# Patient Record
Sex: Male | Born: 1935 | Race: Black or African American | Hispanic: No | Marital: Married | Smoking: Never smoker
Health system: Southern US, Community
[De-identification: ages and names within clinical notes are randomized; demographics above are authoritative.]

## PROBLEM LIST (undated history)

## (undated) DIAGNOSIS — H547 Unspecified visual loss: Secondary | ICD-10-CM

## (undated) DIAGNOSIS — H409 Unspecified glaucoma: Secondary | ICD-10-CM

---

## 2000-05-20 ENCOUNTER — Encounter (INDEPENDENT_AMBULATORY_CARE_PROVIDER_SITE_OTHER): Payer: Self-pay | Admitting: Specialist

## 2000-05-20 ENCOUNTER — Other Ambulatory Visit: Admission: RE | Admit: 2000-05-20 | Discharge: 2000-05-20 | Payer: Self-pay | Admitting: Urology

## 2000-06-07 ENCOUNTER — Encounter: Admission: RE | Admit: 2000-06-07 | Discharge: 2000-06-07 | Payer: Self-pay | Admitting: Urology

## 2000-06-07 ENCOUNTER — Encounter: Payer: Self-pay | Admitting: Urology

## 2000-06-10 ENCOUNTER — Encounter: Admission: RE | Admit: 2000-06-10 | Discharge: 2000-09-08 | Payer: Self-pay | Admitting: Radiation Oncology

## 2000-09-24 ENCOUNTER — Encounter: Admission: RE | Admit: 2000-09-24 | Discharge: 2000-12-23 | Payer: Self-pay | Admitting: Radiation Oncology

## 2000-10-21 ENCOUNTER — Encounter: Payer: Self-pay | Admitting: Urology

## 2000-10-21 ENCOUNTER — Ambulatory Visit (HOSPITAL_BASED_OUTPATIENT_CLINIC_OR_DEPARTMENT_OTHER): Admission: RE | Admit: 2000-10-21 | Discharge: 2000-10-21 | Payer: Self-pay | Admitting: Urology

## 2004-10-16 ENCOUNTER — Encounter: Admission: RE | Admit: 2004-10-16 | Discharge: 2004-10-16 | Payer: Self-pay | Admitting: Internal Medicine

## 2008-11-13 ENCOUNTER — Ambulatory Visit (HOSPITAL_COMMUNITY): Admission: RE | Admit: 2008-11-13 | Discharge: 2008-11-13 | Payer: Self-pay | Admitting: Specialist

## 2011-03-06 NOTE — Op Note (Signed)
[place]. [facility]  Patient:    Greg Scott, Greg Scott                          MRN: [id number] Proc. Date: 10/21/00 Adm. Date:  [id number] Attending:  Londell Moh CC:         Margaretmary Dys, M.D.             Juluis Mire, M.D.                           Operative Report  PREOPERATIVE DIAGNOSIS:  Carcinoma of the prostate.  POSTOPERATIVE DIAGNOSIS:  Carcinoma of the prostate.  PROCEDURE PERFORMED:  Radiation seed implantation.  SURGEON:  Jamison Neighbor, M.D.  RADIATION ONCOLOGIST:  Margaretmary Dys, M.D., Maryln Gottron, M.D.  COMPLICATIONS:  None.  DRAINS:  16-French Foley catheter.  BRIEF HISTORY:  This 77 year old male status post TURP a number of years ago. The patient has been followed for sometime and never had any trouble with difficulty voiding.  We did note however there was a rise on his PSA and we noted that there was regrowth of the prostate on rectal examination.  The PSA went to 6.79 and for that reason underwent prostatic ultrasound guided biopsy. He was found to have a Gleason score of 6 adenocarcinoma on the right in 5% of biopsy, and Gleason score 6 adenocarcinoma on the left in 30% of the biopsy. The patient was advised of his options including watchful waiting, surgery, external beam radiation therapy, radiation seeds, cryosurgery or delayed hormonal therapy.  The patient felt he did not wish to have radical prostatectomy at his age and elected to undergo seed implantation.  We did appraise him of the fact that with a TURP defect, there is an increase risk that seeds will enter the prostate and/or urethra, but given the relatively large volume of his prostate on ultrasound, it is likely that there is adequate tissue for implantation.  The patient has been evaluated by the radiation oncologist and fully appraised of the pros and cons of this procedure both by me and by the radiation team.  He gave full and  informed consent.  DESCRIPTION OF PROCEDURE:  After successful induction of general anesthesia, the patient was placed in the dorsolithotomy position, prepped with Betadine, and draped in the usual sterile fashion.  He had a Foley catheter in place as well as a rectal tube.  The step device was secured to the bed and the ultrasound probe was inserted.  The prostate was imaged and the patient was positioned so that the images were identical to those seen in the preplanning session.  Anchors were placed at C3.0 and E3.0 with good stabilization.  The patient had a total of 80 seeds placed through 20 needles, and postoperative imaging showed excellent coverage of the prostate with good sparing of the central urethra.  The patient has a target dose of 145,000 of the I125 Isotop. Cystoscopy was performed.  The urethra was visualized in its entirety and there was no evidence of any seed material within the urethra or within the bladder.  The patient does have some regrowth of the prostate, but does not appear to be obstructed.  Postoperative fluoroscopy showed very good coverage, but we do note that one set of seeds was pulled down in the perineum but as far from the rectum.  The patient had a Foley catheter  reinserted and placed to straight drainage.  He will be sent home with Darvocet-N 100, Pyridium he can take for burning and Septra DS.  He will return to the office in one to two days for removal of the Foley catheter.  The patient tolerated the procedure and was taken to the recovery room in good condition. DD:  10/21/00 TD:  10/21/00 Job: [id number]

## 2014-11-01 ENCOUNTER — Emergency Department (HOSPITAL_COMMUNITY): Payer: Medicare PPO

## 2014-11-01 ENCOUNTER — Emergency Department (HOSPITAL_COMMUNITY)
Admission: EM | Admit: 2014-11-01 | Discharge: 2014-11-01 | Disposition: A | Discharge status: Home / Self Care | Payer: Medicare PPO | Attending: Emergency Medicine | Admitting: Emergency Medicine

## 2014-11-01 ENCOUNTER — Encounter (HOSPITAL_COMMUNITY): Payer: Self-pay | Admitting: Emergency Medicine

## 2014-11-01 DIAGNOSIS — R270 Ataxia, unspecified: Secondary | ICD-10-CM | POA: Diagnosis not present

## 2014-11-01 DIAGNOSIS — Z792 Long term (current) use of antibiotics: Secondary | ICD-10-CM | POA: Diagnosis not present

## 2014-11-01 DIAGNOSIS — H539 Unspecified visual disturbance: Secondary | ICD-10-CM | POA: Diagnosis not present

## 2014-11-01 DIAGNOSIS — R2689 Other abnormalities of gait and mobility: Secondary | ICD-10-CM | POA: Diagnosis present

## 2014-11-01 LAB — CBC WITH DIFFERENTIAL/PLATELET
BASOS PCT: 0 % (ref 0–1)
Basophils Absolute: 0 10*3/uL (ref 0.0–0.1)
EOS ABS: 0 10*3/uL (ref 0.0–0.7)
EOS PCT: 0 % (ref 0–5)
HCT: 39.4 % (ref 39.0–52.0)
Hemoglobin: 13.4 g/dL (ref 13.0–17.0)
Lymphocytes Relative: 19 % (ref 12–46)
Lymphs Abs: 1.4 10*3/uL (ref 0.7–4.0)
MCH: 32.8 pg (ref 26.0–34.0)
MCHC: 34 g/dL (ref 30.0–36.0)
MCV: 96.3 fL (ref 78.0–100.0)
Monocytes Absolute: 0.2 10*3/uL (ref 0.1–1.0)
Monocytes Relative: 3 % (ref 3–12)
NEUTROS ABS: 5.5 10*3/uL (ref 1.7–7.7)
NEUTROS PCT: 78 % — AB (ref 43–77)
Platelets: 221 10*3/uL (ref 150–400)
RBC: 4.09 MIL/uL — ABNORMAL LOW (ref 4.22–5.81)
RDW: 14.7 % (ref 11.5–15.5)
WBC: 7.2 10*3/uL (ref 4.0–10.5)

## 2014-11-01 LAB — PROTIME-INR
INR: 1.04 (ref 0.00–1.49)
Prothrombin Time: 13.7 seconds (ref 11.6–15.2)

## 2014-11-01 LAB — BASIC METABOLIC PANEL
Anion gap: 9 (ref 5–15)
BUN: 11 mg/dL (ref 6–23)
CO2: 26 mmol/L (ref 19–32)
Calcium: 8.8 mg/dL (ref 8.4–10.5)
Chloride: 106 mEq/L (ref 96–112)
Creatinine, Ser: 1.38 mg/dL — ABNORMAL HIGH (ref 0.50–1.35)
GFR calc Af Amer: 51 mL/min — ABNORMAL LOW (ref >90)
GFR calc non Af Amer: 44 mL/min — ABNORMAL LOW (ref >90)
GLUCOSE: 165 mg/dL — AB (ref 70–99)
POTASSIUM: 3.9 mmol/L (ref 3.5–5.1)
Sodium: 141 mmol/L (ref 135–145)

## 2014-11-01 LAB — TROPONIN I: Troponin I: <0.03 ng/mL (ref <0.031)

## 2014-11-01 NOTE — ED Provider Notes (Addendum)
CSN: [id number]     Arrival date & time 11/01/14  1258 History   First MD Initiated Contact with Patient 11/01/14 1310     Chief Complaint  Patient presents with  . Gait Problem  . Altered Mental Status      HPI  Patient presents for evaluation with difficulty ambulating after being seen by his eye doctor and having his eyes dilated.  Patient has no history of stroke or TIAs. Was in his normal state of health. [place] for cataracts and glaucoma. His eyes dilated. Walk only a short distance to his car afterwards. [place]. When getting out of the car and walking to the steps into the house he had difficulty.  Simply back in the car and was driving him to his doctor's office. They realize he didn't have identification or insurance information so they drove back to the house. The patient was unable to walk into the house and up the stairs with some assistance. States his symptoms had improved but not resolved.  Denies any weakness. He states he is wearing "heavy left issues that make it difficult". He has not noticed any difference left versus right. He does not have a headache. He states "the room looks steamy everything is a little blurry". He reports sustaining vision is normal since his glaucoma. His pressures were normal at his ophthalmologist today.  History reviewed. No pertinent past medical history. History reviewed. No pertinent past surgical history. History reviewed. No pertinent family history. History  Substance Use Topics  . Smoking status: Not on file  . Smokeless tobacco: Not on file  . Alcohol Use: Not on file    Review of Systems  Constitutional: Negative for fever, chills, diaphoresis, appetite change and fatigue.  HENT: Negative for mouth sores, sore throat and trouble swallowing.   Eyes: Positive for visual disturbance.  Respiratory: Negative for cough, chest  tightness, shortness of breath and wheezing.   Cardiovascular: Negative for chest pain.  Gastrointestinal: Negative for nausea, vomiting, abdominal pain, diarrhea and abdominal distention.  Endocrine: Negative for polydipsia, polyphagia and polyuria.  Genitourinary: Negative for dysuria, frequency and hematuria.  Musculoskeletal: Positive for gait problem.  Skin: Negative for color change, pallor and rash.  Neurological: Negative for dizziness, syncope, light-headedness and headaches.  Hematological: Does not bruise/bleed easily.  Psychiatric/Behavioral: Negative for behavioral problems and confusion.      Allergies  Review of patient's allergies indicates no known allergies.  Home Medications   Prior to Admission medications   Medication Sig Start Date End Date Taking? Authorizing Provider  latanoprost (XALATAN) 0.005 % ophthalmic solution Place 1 drop into both eyes at bedtime.   Yes Historical Provider, MD  tobramycin (TOBREX) 0.3 % ophthalmic solution Place 1 drop into both eyes 2 (two) times daily.   Yes Historical Provider, MD   BP 155/72 mmHg  Pulse 98  Temp(Src) 98.8 F (37.1 C) (Oral)  Resp 18  SpO2 100% Physical Exam  Constitutional: He is oriented to person, place, and time. He appears well-developed and well-nourished. No distress.  HENT:  Head: Normocephalic.  Eyes: Conjunctivae are normal. Pupils are equal, round, and reactive to light. No scleral icterus.  Symmetric dilatation proximally to 9 mm. Retinas appear normal.  Neck: Normal range of motion. Neck supple. No thyromegaly present.  Cardiovascular: Normal rate and regular rhythm.  Exam reveals no gallop and no friction rub.   No murmur heard. Pulmonary/Chest: Effort  normal and breath sounds normal. No respiratory distress. He has no wheezes. He has no rales.  Abdominal: Soft. Bowel sounds are normal. He exhibits no distension. There is no tenderness. There is no rebound.  Musculoskeletal: Normal range of  motion.  Neurological: He is alert and oriented to person, place, and time.  Normal cranial nerve exam with exception of his decreased vision changes as above. No pronator drift. No leg drift. No difficulty with rapid alternating movements. As he stands he has has normal strength reasonably do it in the room. He has somewhat broad-based gait which son describes as normal for him.  Skin: Skin is warm and dry. No rash noted.  Psychiatric: He has a normal mood and affect. His behavior is normal.    ED Course  Procedures (including critical care time) Labs Review Labs Reviewed  CBC WITH DIFFERENTIAL - Abnormal; Notable for the following:    RBC 4.09 (*)    Neutrophils Relative % 78 (*)    All other components within normal limits  BASIC METABOLIC PANEL - Abnormal; Notable for the following:    Glucose, Bld 165 (*)    Creatinine, Ser 1.38 (*)    GFR calc non Af Amer 44 (*)    GFR calc Af Amer 51 (*)    All other components within normal limits  PROTIME-INR  TROPONIN I    Imaging Review Ct Head Wo Contrast  11/01/2014   CLINICAL DATA:  Code stroke. Ataxia. Increased confusion from baseline. Symptoms began at 12 o'clock today.  EXAM: CT HEAD WITHOUT CONTRAST  TECHNIQUE: Contiguous axial images were obtained from the base of the skull through the vertex without intravenous contrast.  COMPARISON:  None.  FINDINGS: Moderate generalized atrophy is present. No acute infarct, hemorrhage, or mass lesion is present. The basal ganglia are intact. The ventricles are proportionate to the degree of atrophy. There is no significant extra-axial fluid collection.  Atherosclerotic changes are noted in the cavernous internal carotid arteries. The paranasal sinuses and mastoid air cells are clear. The cannot scratch the the calvarium is intact.  IMPRESSION: 1. Moderate generalized atrophy. 2. No acute intracranial abnormality or evidence for acute infarct. These results were called by telephone at the time of  interpretation on 11/01/2014 at 1:56 pm to Dr. Rolland Porter , who verbally acknowledged these results.   Electronically Signed   By: Gennette Pac M.D.   On: 11/01/2014 13:56     EKG Interpretation None      MDM   Final diagnoses:  Ataxia    I discussed the case with Dr. Cyril Mourning upon the patient's arrival I did not feel his neck criteria for stroke face normal exam of the sensation of difficulty walking with dilated eyes. Dr. Cyril Mourning felt this was appropriate to not activate him the code stroke and felt that with normal imaging the patient can be discharged of his symptoms improved.  Head CT shows no acute abnormalities.  You have difficulty with the MRI and refused any additional procedures. Axial images of his MRI were able to be obtained before he insists upon being removed from the MRI secondary to claustrophobia.  I have reexamined him in the room and he is able to ambulate states his symptoms are "much much better".  He was able to undergo T2 sagittal images. With this limited imaging no acute rebound is noted. Patient maintains that he is at his baseline and think he is appropriate for discharge.  I discussed with he and his  son that his symptoms are likely related to the vision changes from the dilation. As his dilated eyes have improved still has his ability to ambulate here. He has any worsening symptoms whatsoever at home of asked him to recheck and expressed understanding of this.  I discussed upon the patient's arrival and on my first recheck the possibility of staying in the hospital for further testing for TIAs and the patient is adamant that "I'm fine and I really don't want to stay". He maintains this at discharge.    Rolland Porter, MD 11/01/14 1528  Rolland Porter, MD 11/01/14 [phone]

## 2014-11-01 NOTE — ED Notes (Addendum)
Pt reports to ED for ataxia with ambulation, increased confusion from baseline, onset about 1200 today. Pt states that he felt as though his shoes were too heavy for his feet to lift them. Neuro exam intact, pt denies headache, pt able to walk without much difficulty in triage, family member states that patient was unable to walk from car to house.

## 2014-11-01 NOTE — ED Notes (Signed)
Patient was not in room in order to get vital

## 2014-11-01 NOTE — ED Notes (Signed)
Patient transported to MRI 

## 2014-11-01 NOTE — ED Notes (Signed)
Patient transported to CT 

## 2014-11-01 NOTE — ED Notes (Signed)
MD at bedside. 

## 2014-11-01 NOTE — ED Notes (Signed)
Fayrene FearingJames, MD, made aware of stroke-like symptoms onset 1 hour ago.

## 2014-11-01 NOTE — Discharge Instructions (Signed)
Return to the emergency room if you have any additional difficulties with your balance coordination or strength.

## 2015-04-22 ENCOUNTER — Emergency Department (HOSPITAL_COMMUNITY): Payer: Medicare PPO

## 2015-04-22 ENCOUNTER — Inpatient Hospital Stay (HOSPITAL_COMMUNITY)
Admission: EM | Admit: 2015-04-22 | Discharge: 2015-05-10 | DRG: 689 | Disposition: A | Discharge status: Skilled Nursing Facility | Payer: Medicare PPO | Attending: Internal Medicine | Admitting: Internal Medicine

## 2015-04-22 ENCOUNTER — Encounter (HOSPITAL_COMMUNITY): Payer: Self-pay

## 2015-04-22 DIAGNOSIS — E875 Hyperkalemia: Secondary | ICD-10-CM | POA: Diagnosis present

## 2015-04-22 DIAGNOSIS — H547 Unspecified visual loss: Secondary | ICD-10-CM | POA: Diagnosis present

## 2015-04-22 DIAGNOSIS — S2239XA Fracture of one rib, unspecified side, initial encounter for closed fracture: Secondary | ICD-10-CM | POA: Diagnosis present

## 2015-04-22 DIAGNOSIS — Z79899 Other long term (current) drug therapy: Secondary | ICD-10-CM

## 2015-04-22 DIAGNOSIS — R1310 Dysphagia, unspecified: Secondary | ICD-10-CM | POA: Diagnosis present

## 2015-04-22 DIAGNOSIS — Z66 Do not resuscitate: Secondary | ICD-10-CM | POA: Diagnosis not present

## 2015-04-22 DIAGNOSIS — F1010 Alcohol abuse, uncomplicated: Secondary | ICD-10-CM | POA: Diagnosis present

## 2015-04-22 DIAGNOSIS — R4182 Altered mental status, unspecified: Secondary | ICD-10-CM

## 2015-04-22 DIAGNOSIS — H409 Unspecified glaucoma: Secondary | ICD-10-CM | POA: Diagnosis present

## 2015-04-22 DIAGNOSIS — F10239 Alcohol dependence with withdrawal, unspecified: Secondary | ICD-10-CM | POA: Diagnosis present

## 2015-04-22 DIAGNOSIS — N3001 Acute cystitis with hematuria: Principal | ICD-10-CM | POA: Diagnosis present

## 2015-04-22 DIAGNOSIS — D509 Iron deficiency anemia, unspecified: Secondary | ICD-10-CM | POA: Insufficient documentation

## 2015-04-22 DIAGNOSIS — E876 Hypokalemia: Secondary | ICD-10-CM | POA: Diagnosis present

## 2015-04-22 DIAGNOSIS — Z7189 Other specified counseling: Secondary | ICD-10-CM | POA: Insufficient documentation

## 2015-04-22 DIAGNOSIS — H540 Blindness, both eyes: Secondary | ICD-10-CM | POA: Diagnosis present

## 2015-04-22 DIAGNOSIS — R296 Repeated falls: Secondary | ICD-10-CM | POA: Diagnosis present

## 2015-04-22 DIAGNOSIS — Z681 Body mass index (BMI) 19 or less, adult: Secondary | ICD-10-CM

## 2015-04-22 DIAGNOSIS — N390 Urinary tract infection, site not specified: Secondary | ICD-10-CM

## 2015-04-22 DIAGNOSIS — R55 Syncope and collapse: Secondary | ICD-10-CM | POA: Diagnosis present

## 2015-04-22 DIAGNOSIS — G9340 Encephalopathy, unspecified: Secondary | ICD-10-CM | POA: Diagnosis present

## 2015-04-22 DIAGNOSIS — Z7401 Bed confinement status: Secondary | ICD-10-CM

## 2015-04-22 DIAGNOSIS — N179 Acute kidney failure, unspecified: Secondary | ICD-10-CM

## 2015-04-22 DIAGNOSIS — Z515 Encounter for palliative care: Secondary | ICD-10-CM

## 2015-04-22 DIAGNOSIS — R627 Adult failure to thrive: Secondary | ICD-10-CM | POA: Diagnosis present

## 2015-04-22 DIAGNOSIS — E860 Dehydration: Secondary | ICD-10-CM | POA: Diagnosis present

## 2015-04-22 DIAGNOSIS — E869 Volume depletion, unspecified: Secondary | ICD-10-CM | POA: Diagnosis present

## 2015-04-22 DIAGNOSIS — L89152 Pressure ulcer of sacral region, stage 2: Secondary | ICD-10-CM | POA: Diagnosis present

## 2015-04-22 DIAGNOSIS — R8281 Pyuria: Secondary | ICD-10-CM | POA: Diagnosis present

## 2015-04-22 DIAGNOSIS — T68XXXA Hypothermia, initial encounter: Secondary | ICD-10-CM

## 2015-04-22 DIAGNOSIS — E43 Unspecified severe protein-calorie malnutrition: Secondary | ICD-10-CM | POA: Insufficient documentation

## 2015-04-22 DIAGNOSIS — F0390 Unspecified dementia without behavioral disturbance: Secondary | ICD-10-CM | POA: Diagnosis present

## 2015-04-22 DIAGNOSIS — E162 Hypoglycemia, unspecified: Secondary | ICD-10-CM | POA: Insufficient documentation

## 2015-04-22 DIAGNOSIS — L8990 Pressure ulcer of unspecified site, unspecified stage: Secondary | ICD-10-CM | POA: Diagnosis present

## 2015-04-22 HISTORY — DX: Unspecified visual loss: H54.7

## 2015-04-22 HISTORY — DX: Unspecified glaucoma: H40.9

## 2015-04-22 LAB — URINALYSIS, ROUTINE W REFLEX MICROSCOPIC
GLUCOSE, UA: NEGATIVE mg/dL
Hgb urine dipstick: NEGATIVE
Ketones, ur: 15 mg/dL — AB
Nitrite: POSITIVE — AB
PH: 5.5 (ref 5.0–8.0)
Protein, ur: 30 mg/dL — AB
Specific Gravity, Urine: 1.025 (ref 1.005–1.030)
UROBILINOGEN UA: 1 mg/dL (ref 0.0–1.0)

## 2015-04-22 LAB — URINE MICROSCOPIC-ADD ON

## 2015-04-22 LAB — CBC WITH DIFFERENTIAL/PLATELET
Basophils Absolute: 0 10*3/uL (ref 0.0–0.1)
Basophils Relative: 0 % (ref 0–1)
Eosinophils Absolute: 0 10*3/uL (ref 0.0–0.7)
Eosinophils Relative: 0 % (ref 0–5)
HCT: 29 % — ABNORMAL LOW (ref 39.0–52.0)
HEMOGLOBIN: 9.9 g/dL — AB (ref 13.0–17.0)
LYMPHS ABS: 0.4 10*3/uL — AB (ref 0.7–4.0)
Lymphocytes Relative: 4 % — ABNORMAL LOW (ref 12–46)
MCH: 33.3 pg (ref 26.0–34.0)
MCHC: 34.1 g/dL (ref 30.0–36.0)
MCV: 97.6 fL (ref 78.0–100.0)
Monocytes Absolute: 0.2 10*3/uL (ref 0.1–1.0)
Monocytes Relative: 2 % — ABNORMAL LOW (ref 3–12)
NEUTROS ABS: 8 10*3/uL — AB (ref 1.7–7.7)
Neutrophils Relative %: 94 % — ABNORMAL HIGH (ref 43–77)
Platelets: 151 10*3/uL (ref 150–400)
RBC: 2.97 MIL/uL — ABNORMAL LOW (ref 4.22–5.81)
RDW: 13.5 % (ref 11.5–15.5)
WBC: 8.6 10*3/uL (ref 4.0–10.5)

## 2015-04-22 LAB — COMPREHENSIVE METABOLIC PANEL
ALBUMIN: 2.5 g/dL — AB (ref 3.5–5.0)
ALT: 36 U/L (ref 17–63)
AST: 46 U/L — AB (ref 15–41)
Alkaline Phosphatase: 50 U/L (ref 38–126)
Anion gap: 13 (ref 5–15)
BUN: 35 mg/dL — AB (ref 6–20)
CALCIUM: 8.1 mg/dL — AB (ref 8.9–10.3)
CHLORIDE: 110 mmol/L (ref 101–111)
CO2: 23 mmol/L (ref 22–32)
Creatinine, Ser: 1.31 mg/dL — ABNORMAL HIGH (ref 0.61–1.24)
GFR calc Af Amer: 54 mL/min — ABNORMAL LOW (ref >60)
GFR, EST NON AFRICAN AMERICAN: 47 mL/min — AB (ref >60)
GLUCOSE: 151 mg/dL — AB (ref 65–99)
Potassium: 3.2 mmol/L — ABNORMAL LOW (ref 3.5–5.1)
Sodium: 146 mmol/L — ABNORMAL HIGH (ref 135–145)
Total Bilirubin: 1.7 mg/dL — ABNORMAL HIGH (ref 0.3–1.2)
Total Protein: 5.2 g/dL — ABNORMAL LOW (ref 6.5–8.1)

## 2015-04-22 LAB — CBG MONITORING, ED: GLUCOSE-CAPILLARY: 140 mg/dL — AB (ref 65–99)

## 2015-04-22 LAB — I-STAT TROPONIN, ED: Troponin i, poc: 0.04 ng/mL (ref 0.00–0.08)

## 2015-04-22 LAB — PROTIME-INR
INR: 1.19 (ref 0.00–1.49)
PROTHROMBIN TIME: 15.2 s (ref 11.6–15.2)

## 2015-04-22 LAB — TSH: TSH: 2.416 u[IU]/mL (ref 0.350–4.500)

## 2015-04-22 LAB — LACTIC ACID, PLASMA: Lactic Acid, Venous: 2.1 mmol/L (ref 0.5–2.0)

## 2015-04-22 LAB — AMMONIA: Ammonia: 16 umol/L (ref 9–35)

## 2015-04-22 LAB — T4, FREE: Free T4: 0.88 ng/dL (ref 0.61–1.12)

## 2015-04-22 LAB — CK: Total CK: 533 U/L — ABNORMAL HIGH (ref 49–397)

## 2015-04-22 MED ORDER — VANCOMYCIN HCL IN DEXTROSE 1-5 GM/200ML-% IV SOLN
1000.0000 mg | Freq: Once | INTRAVENOUS | Status: AC
  Administered 2015-04-22: 1000 mg via INTRAVENOUS
  Filled 2015-04-22: qty 200

## 2015-04-22 MED ORDER — SODIUM CHLORIDE 0.9 % IV BOLUS (SEPSIS)
1000.0000 mL | Freq: Once | INTRAVENOUS | Status: AC
  Administered 2015-04-22: 1000 mL via INTRAVENOUS

## 2015-04-22 MED ORDER — SODIUM CHLORIDE 0.9 % IV BOLUS (SEPSIS)
1000.0000 mL | Freq: Once | INTRAVENOUS | Status: AC
Start: 2015-04-23 — End: 2015-04-23
  Administered 2015-04-23: 1000 mL via INTRAVENOUS

## 2015-04-22 MED ORDER — PIPERACILLIN-TAZOBACTAM IN DEX 2-0.25 GM/50ML IV SOLN
2.2500 g | Freq: Once | INTRAVENOUS | Status: AC
  Administered 2015-04-22: 2.25 g via INTRAVENOUS
  Filled 2015-04-22: qty 50

## 2015-04-22 NOTE — ED Notes (Signed)
bair hugger placed 

## 2015-04-22 NOTE — ED Notes (Signed)
Blanket warmer applied, labs drawn, family at bedside

## 2015-04-22 NOTE — ED Notes (Signed)
Family at the bedside.  Pt non-verbal  Pulls away from pain

## 2015-04-22 NOTE — ED Provider Notes (Signed)
CSN: [id number]     Arrival date & time 04/22/15  2101 History   First MD Initiated Contact with Patient 04/22/15 2113     Chief Complaint  Patient presents with  . Altered Mental Status   (Consider location/radiation/quality/duration/timing/severity/associated sxs/prior Treatment) Patient is a 81 y.o. male presenting with altered mental status. The history is provided by a relative and the EMS personnel. The history is limited by the condition of the patient.  Altered Mental Status Presenting symptoms: behavior changes, confusion, lethargy and partial responsiveness   Severity:  Severe Most recent episode: last seen 4 days ago by son. Timing:  Unable to specify Progression:  Worsening Chronicity:  New Context: dementia   Associated symptoms: weakness   Associated symptoms: no eye deviation and no visual change (blind at baseline)     Past Medical History  Diagnosis Date  . Glaucoma   . Blind    History reviewed. No pertinent past surgical history. History reviewed. No pertinent family history. History  Substance Use Topics  . Smoking status: Unknown If Ever Smoked  . Smokeless tobacco: Not on file  . Alcohol Use: Not on file    Review of Systems  Unable to perform ROS: Patient nonverbal  Neurological: Positive for speech difficulty and weakness.  Psychiatric/Behavioral: Positive for confusion.      Allergies  Review of patient's allergies indicates no known allergies.  Home Medications   Prior to Admission medications   Medication Sig Start Date End Date Taking? Authorizing Provider  latanoprost (XALATAN) 0.005 % ophthalmic solution Place 1 drop into both eyes at bedtime.   Yes Historical Provider, MD   ED Triage Vitals  Enc Vitals Group     BP 04/22/15 2114 139/88 mmHg     Pulse Rate 04/22/15 2114 30     Resp 04/22/15 2114 17     Temp 04/22/15 2111 95.6 F (35.3 C)     Temp Source 04/22/15 2111 Rectal     SpO2 04/22/15 2114 90 %     Weight 04/22/15 2114  120 lb (54.432 kg)     Height 04/22/15 2114 6' (1.829 m)     Head Cir --      Peak Flow --      Pain Score --      Pain Loc --      Pain Edu? --      Excl. in GC? --     Physical Exam  Constitutional: He appears well-developed. He appears lethargic. He has a sickly appearance. Nasal cannula in place.  HENT:  Head: Normocephalic and atraumatic.  Nose: Nose normal.  Mouth/Throat: Oropharynx is clear and moist. No oropharyngeal exudate.  Eyes: Pupils are equal, round, and reactive to light.  Sluggish but bilaterally symmetric and reactive pupils.  Significant cataracts b/l.  Blind/does not track   Neck: Normal range of motion. Neck supple.  Cardiovascular: Normal rate, regular rhythm, normal heart sounds and intact distal pulses.   No murmur heard. Pulmonary/Chest: Effort normal and breath sounds normal. No respiratory distress. He has no wheezes. He exhibits no tenderness.  Abdominal: Soft. He exhibits no distension. There is no tenderness. There is no guarding.  Genitourinary:  Loss of bladder/bowel   Musculoskeletal: Normal range of motion. He exhibits no tenderness.  Neurological: He appears lethargic. No cranial nerve deficit. GCS eye subscore is 3. GCS verbal subscore is 1. GCS motor subscore is 5.  Nonverbal.  Initially would open eyes to verbal stimuli and localize to pain.  Does  not follow commands.  Seen moving all extremities minimally.  Does not hold extremities up against gravity  Skin: Skin is warm and dry. He is not diaphoretic. No pallor.  Nursing note and vitals reviewed.   ED Course  Procedures (including critical care time) Labs Review Labs Reviewed  CBC WITH DIFFERENTIAL/PLATELET - Abnormal; Notable for the following:    RBC 2.97 (*)    Hemoglobin 9.9 (*)    HCT 29.0 (*)    Neutrophils Relative % 94 (*)    Neutro Abs 8.0 (*)    Lymphocytes Relative 4 (*)    Lymphs Abs 0.4 (*)    Monocytes Relative 2 (*)    All other components within normal limits   COMPREHENSIVE METABOLIC PANEL - Abnormal; Notable for the following:    Sodium 146 (*)    Potassium 3.2 (*)    Glucose, Bld 151 (*)    BUN 35 (*)    Creatinine, Ser 1.31 (*)    Calcium 8.1 (*)    Total Protein 5.2 (*)    Albumin 2.5 (*)    AST 46 (*)    Total Bilirubin 1.7 (*)    GFR calc non Af Amer 47 (*)    GFR calc Af Amer 54 (*)    All other components within normal limits  URINALYSIS, ROUTINE W REFLEX MICROSCOPIC (NOT AT [facility]) - Abnormal; Notable for the following:    Color, Urine AMBER (*)    Bilirubin Urine LARGE (*)    Ketones, ur 15 (*)    Protein, ur 30 (*)    Nitrite POSITIVE (*)    Leukocytes, UA SMALL (*)    All other components within normal limits  LACTIC ACID, PLASMA - Abnormal; Notable for the following:    Lactic Acid, Venous 2.1 (*)    All other components within normal limits  CK - Abnormal; Notable for the following:    Total CK 533 (*)    All other components within normal limits  CBG MONITORING, ED - Abnormal; Notable for the following:    Glucose-Capillary 140 (*)    All other components within normal limits  CULTURE, BLOOD (ROUTINE X 2)  CULTURE, BLOOD (ROUTINE X 2)  URINE CULTURE  PROTIME-INR  AMMONIA  TSH  T4, FREE  URINE MICROSCOPIC-ADD ON  I-STAT TROPOININ, ED    Imaging Review Dg Chest Portable 1 View  04/22/2015   CLINICAL DATA:  Altered mental status after being found down  EXAM: PORTABLE CHEST - 1 VIEW  COMPARISON:  11/13/2008  FINDINGS: Normal heart size and mediastinal contours.  No acute infiltrate or edema. No effusion or pneumothorax. Probable nipple shadow on the left.  Remote right fifth and sixth rib fractures. No acute osseous findings.  IMPRESSION: 1. No active disease. 2. Nodular density at the left base is likely a nipple shadow. Two-view chest with nipple markers could confirm if clinically appropriate.   Electronically Signed   By: Marnee Spring M.D.   On: 04/22/2015 21:36     EKG Interpretation   Date/Time:  Monday  April 22 2015 22:50:04 EDT Ventricular Rate:  79 PR Interval:  164 QRS Duration: 99 QT Interval:  453 QTC Calculation: 519 R Axis:   22 Text Interpretation:  Sinus rhythm Ventricular premature complex  Anteroseptal infarct, old Nonspecific T abnormalities, lateral leads  Prolonged QT interval No significant change since last tracing Confirmed  by Anitra Lauth  MD, Alphonzo Lemmings ([id number]) on 04/22/2015 11:53:29 PM      MDM  Final diagnoses:  Altered mental status, unspecified altered mental status type  AKI (acute kidney injury)   Pt is a 81 yo M with hx of glaucoma and blindness who presents today with AMS after being found down for an unknown amount of time.  Patient is nonverbal, so hx was obtained from his son.  Per report, patient has had a steady decline over the past few weeks.  Used to be able to take care of himself other than mild confusion (son took care of bills, etc but pt lived by himself).  Has had increasing difficulty ambulating over the past few months, then for a few weeks has been fairly nonverbal.  Son last saw him on Thursday night then went out of town (4 days ago).  They had a family friend that was supposed to stop in and check on the patient every day, but it is unclear if this ever happened.  Son called the patient and the caregiver every day but was unable to get in touch with either of them.  Came home today and found the patient slumped over 1/2 way on the couch, unresponsive.  Called EMS.   EMS found him with normal glucose, cool to the touch, palpable pulses and normotensive.  He was responsive to painful stimuli and had sluggishly reactive pupils.  Received < 500 cc NS bolus prior to arrival to the ED.  Hypothermic to 95.6 rectally on arrival to the ED.  Smelled strongly of urine.  Would open eyes to verbal stimuli and would localize to pain, but would not follow commands.  GCS 9 on arrival.   Given NS boluses, placed on bear hugger, and covered with broad spectrum Abx  (vanc and zosyn).  Worked up with septic labs.  Blood and urine cultures.  Temp foley placed.  CXR, EKG, and CT head ordered.   CXR with no obvious opacities or effusions.  Old rib fractures on right on rads report.  EKG with nonspecific T wave inversions but otherwise benign. Unchanged from January.  UA + for UTI CT head with no acute changes.  Labs show normal troponin, no leukocytosis, Cr 1.31 (at baseline), electrolytes unremarkable, but lactate 2.1 and CK 533 suggestive of dehydration.   Has perked up a fair amount since arrival from warming and receiving fluids.  Now is more alert and responsive.  Still not verbal or able to follow commands, but does hold eyes open and turns towards verbal stimuli more.  BP still normotensive.   Admitted to hospitalist team for further management of his urosepsis and dehydration.    If performed, labs, EKGs, and imaging were reviewed and interpreted by myself and my attending, and incorporated in the medical decision making.  Patient was seen with ED Attending, Dr. Lawernce Keas, MD     Lenell Antu, MD 04/23/15 1432  Gwyneth Sprout, MD 04/25/15 1515

## 2015-04-22 NOTE — ED Notes (Signed)
Pt here by ems, found on floor, last seen Friday by family at baseline, has some early dementia, responding to painful stimuli by grimacing otherwise non verbal, incontinent of urine,per family recent frequent falls, pupils sluggish but equal.

## 2015-04-23 ENCOUNTER — Encounter (HOSPITAL_COMMUNITY): Payer: Self-pay | Admitting: Nephrology

## 2015-04-23 ENCOUNTER — Inpatient Hospital Stay (HOSPITAL_COMMUNITY): Payer: Medicare PPO

## 2015-04-23 DIAGNOSIS — Z7401 Bed confinement status: Secondary | ICD-10-CM | POA: Diagnosis not present

## 2015-04-23 DIAGNOSIS — S2232XA Fracture of one rib, left side, initial encounter for closed fracture: Secondary | ICD-10-CM

## 2015-04-23 DIAGNOSIS — H409 Unspecified glaucoma: Secondary | ICD-10-CM | POA: Diagnosis present

## 2015-04-23 DIAGNOSIS — N390 Urinary tract infection, site not specified: Secondary | ICD-10-CM | POA: Diagnosis not present

## 2015-04-23 DIAGNOSIS — R296 Repeated falls: Secondary | ICD-10-CM | POA: Diagnosis not present

## 2015-04-23 DIAGNOSIS — E875 Hyperkalemia: Secondary | ICD-10-CM | POA: Diagnosis not present

## 2015-04-23 DIAGNOSIS — E860 Dehydration: Secondary | ICD-10-CM | POA: Diagnosis not present

## 2015-04-23 DIAGNOSIS — R404 Transient alteration of awareness: Secondary | ICD-10-CM | POA: Diagnosis not present

## 2015-04-23 DIAGNOSIS — E162 Hypoglycemia, unspecified: Secondary | ICD-10-CM | POA: Diagnosis not present

## 2015-04-23 DIAGNOSIS — R627 Adult failure to thrive: Secondary | ICD-10-CM | POA: Diagnosis not present

## 2015-04-23 DIAGNOSIS — S2231XS Fracture of one rib, right side, sequela: Secondary | ICD-10-CM | POA: Diagnosis not present

## 2015-04-23 DIAGNOSIS — G9340 Encephalopathy, unspecified: Secondary | ICD-10-CM | POA: Diagnosis not present

## 2015-04-23 DIAGNOSIS — R1310 Dysphagia, unspecified: Secondary | ICD-10-CM | POA: Diagnosis not present

## 2015-04-23 DIAGNOSIS — E869 Volume depletion, unspecified: Secondary | ICD-10-CM | POA: Diagnosis present

## 2015-04-23 DIAGNOSIS — R410 Disorientation, unspecified: Secondary | ICD-10-CM | POA: Diagnosis not present

## 2015-04-23 DIAGNOSIS — N3001 Acute cystitis with hematuria: Principal | ICD-10-CM

## 2015-04-23 DIAGNOSIS — L89152 Pressure ulcer of sacral region, stage 2: Secondary | ICD-10-CM | POA: Diagnosis not present

## 2015-04-23 DIAGNOSIS — Z681 Body mass index (BMI) 19 or less, adult: Secondary | ICD-10-CM | POA: Diagnosis not present

## 2015-04-23 DIAGNOSIS — D509 Iron deficiency anemia, unspecified: Secondary | ICD-10-CM | POA: Diagnosis not present

## 2015-04-23 DIAGNOSIS — F10239 Alcohol dependence with withdrawal, unspecified: Secondary | ICD-10-CM | POA: Diagnosis not present

## 2015-04-23 DIAGNOSIS — E43 Unspecified severe protein-calorie malnutrition: Secondary | ICD-10-CM | POA: Diagnosis not present

## 2015-04-23 DIAGNOSIS — F1010 Alcohol abuse, uncomplicated: Secondary | ICD-10-CM | POA: Diagnosis not present

## 2015-04-23 DIAGNOSIS — H540 Blindness, both eyes: Secondary | ICD-10-CM | POA: Diagnosis not present

## 2015-04-23 DIAGNOSIS — Z79899 Other long term (current) drug therapy: Secondary | ICD-10-CM | POA: Diagnosis not present

## 2015-04-23 DIAGNOSIS — R8281 Pyuria: Secondary | ICD-10-CM | POA: Diagnosis present

## 2015-04-23 DIAGNOSIS — E876 Hypokalemia: Secondary | ICD-10-CM

## 2015-04-23 DIAGNOSIS — F0390 Unspecified dementia without behavioral disturbance: Secondary | ICD-10-CM | POA: Diagnosis present

## 2015-04-23 DIAGNOSIS — Z66 Do not resuscitate: Secondary | ICD-10-CM | POA: Diagnosis not present

## 2015-04-23 DIAGNOSIS — T68XXXA Hypothermia, initial encounter: Secondary | ICD-10-CM | POA: Insufficient documentation

## 2015-04-23 DIAGNOSIS — R400 Somnolence: Secondary | ICD-10-CM | POA: Diagnosis not present

## 2015-04-23 DIAGNOSIS — Z515 Encounter for palliative care: Secondary | ICD-10-CM | POA: Diagnosis not present

## 2015-04-23 DIAGNOSIS — S2239XA Fracture of one rib, unspecified side, initial encounter for closed fracture: Secondary | ICD-10-CM | POA: Diagnosis present

## 2015-04-23 DIAGNOSIS — R55 Syncope and collapse: Secondary | ICD-10-CM | POA: Diagnosis present

## 2015-04-23 DIAGNOSIS — R4182 Altered mental status, unspecified: Secondary | ICD-10-CM

## 2015-04-23 DIAGNOSIS — H547 Unspecified visual loss: Secondary | ICD-10-CM | POA: Diagnosis present

## 2015-04-23 DIAGNOSIS — N179 Acute kidney failure, unspecified: Secondary | ICD-10-CM | POA: Insufficient documentation

## 2015-04-23 LAB — GLUCOSE, CAPILLARY
GLUCOSE-CAPILLARY: 135 mg/dL — AB (ref 65–99)
GLUCOSE-CAPILLARY: 141 mg/dL — AB (ref 65–99)
GLUCOSE-CAPILLARY: 92 mg/dL (ref 65–99)
Glucose-Capillary: 137 mg/dL — ABNORMAL HIGH (ref 65–99)
Glucose-Capillary: 99 mg/dL (ref 65–99)

## 2015-04-23 LAB — CBC
HCT: 27.7 % — ABNORMAL LOW (ref 39.0–52.0)
Hemoglobin: 9.8 g/dL — ABNORMAL LOW (ref 13.0–17.0)
MCH: 33.3 pg (ref 26.0–34.0)
MCHC: 35.4 g/dL (ref 30.0–36.0)
MCV: 94.2 fL (ref 78.0–100.0)
Platelets: 144 10*3/uL — ABNORMAL LOW (ref 150–400)
RBC: 2.94 MIL/uL — AB (ref 4.22–5.81)
RDW: 13.4 % (ref 11.5–15.5)
WBC: 10.5 10*3/uL (ref 4.0–10.5)

## 2015-04-23 LAB — RAPID URINE DRUG SCREEN, HOSP PERFORMED
Amphetamines: NOT DETECTED
BENZODIAZEPINES: NOT DETECTED
Barbiturates: NOT DETECTED
COCAINE: NOT DETECTED
Opiates: NOT DETECTED
TETRAHYDROCANNABINOL: NOT DETECTED

## 2015-04-23 LAB — TROPONIN I: TROPONIN I: 0.06 ng/mL — AB (ref <0.031)

## 2015-04-23 LAB — BASIC METABOLIC PANEL
Anion gap: 14 (ref 5–15)
BUN: 30 mg/dL — ABNORMAL HIGH (ref 6–20)
CO2: 20 mmol/L — AB (ref 22–32)
CREATININE: 1.2 mg/dL (ref 0.61–1.24)
Calcium: 7.7 mg/dL — ABNORMAL LOW (ref 8.9–10.3)
Chloride: 111 mmol/L (ref 101–111)
GFR calc non Af Amer: 52 mL/min — ABNORMAL LOW (ref >60)
GFR, EST AFRICAN AMERICAN: 60 mL/min — AB (ref >60)
Glucose, Bld: 155 mg/dL — ABNORMAL HIGH (ref 65–99)
Potassium: 3.1 mmol/L — ABNORMAL LOW (ref 3.5–5.1)
Sodium: 145 mmol/L (ref 135–145)

## 2015-04-23 LAB — CK: Total CK: 323 U/L (ref 49–397)

## 2015-04-23 LAB — ETHANOL: Alcohol, Ethyl (B): <5 mg/dL (ref <5)

## 2015-04-23 LAB — MAGNESIUM: MAGNESIUM: 1.7 mg/dL (ref 1.7–2.4)

## 2015-04-23 LAB — MRSA PCR SCREENING: MRSA BY PCR: NEGATIVE

## 2015-04-23 MED ORDER — THIAMINE HCL 100 MG/ML IJ SOLN
100.0000 mg | Freq: Every day | INTRAMUSCULAR | Status: DC
  Administered 2015-04-24 – 2015-05-10 (×16): 100 mg via INTRAVENOUS
  Filled 2015-04-23: qty 1
  Filled 2015-04-23: qty 2
  Filled 2015-04-23 (×16): qty 1

## 2015-04-23 MED ORDER — VANCOMYCIN HCL 500 MG IV SOLR
500.0000 mg | INTRAVENOUS | Status: DC
  Administered 2015-04-24: 500 mg via INTRAVENOUS
  Filled 2015-04-23: qty 500

## 2015-04-23 MED ORDER — PNEUMOCOCCAL VAC POLYVALENT 25 MCG/0.5ML IJ INJ
0.5000 mL | INJECTION | INTRAMUSCULAR | Status: AC
  Administered 2015-04-24: 0.5 mL via INTRAMUSCULAR
  Filled 2015-04-23: qty 0.5

## 2015-04-23 MED ORDER — FOLIC ACID 5 MG/ML IJ SOLN
1.0000 mg | Freq: Every day | INTRAMUSCULAR | Status: DC
  Filled 2015-04-23: qty 0.2

## 2015-04-23 MED ORDER — PIPERACILLIN-TAZOBACTAM IN DEX 2-0.25 GM/50ML IV SOLN: 2.2500 g | Freq: Three times a day (TID) | INTRAVENOUS | Status: DC

## 2015-04-23 MED ORDER — THIAMINE HCL 100 MG/ML IJ SOLN
Freq: Once | INTRAVENOUS | Status: AC
  Administered 2015-04-23: 22:00:00 via INTRAVENOUS
  Filled 2015-04-23: qty 1000

## 2015-04-23 MED ORDER — WHITE PETROLATUM GEL
Status: AC
Start: 2015-04-23 — End: 2015-04-23
  Administered 2015-04-23: 1
  Filled 2015-04-23: qty 1

## 2015-04-23 MED ORDER — INSULIN ASPART 100 UNIT/ML ~~LOC~~ SOLN: 0.0000 [IU] | SUBCUTANEOUS | Status: DC

## 2015-04-23 MED ORDER — CETYLPYRIDINIUM CHLORIDE 0.05 % MT LIQD
7.0000 mL | Freq: Two times a day (BID) | OROMUCOSAL | Status: DC
  Administered 2015-04-23 – 2015-05-09 (×29): 7 mL via OROMUCOSAL

## 2015-04-23 MED ORDER — POTASSIUM CHLORIDE 10 MEQ/100ML IV SOLN
10.0000 meq | INTRAVENOUS | Status: AC
  Administered 2015-04-23 – 2015-04-24 (×3): 10 meq via INTRAVENOUS
  Filled 2015-04-23 (×3): qty 100

## 2015-04-23 MED ORDER — THIAMINE HCL 100 MG/ML IJ SOLN
100.0000 mg | Freq: Every day | INTRAMUSCULAR | Status: DC
  Filled 2015-04-23: qty 1

## 2015-04-23 MED ORDER — PIPERACILLIN-TAZOBACTAM 3.375 G IVPB
3.3750 g | Freq: Three times a day (TID) | INTRAVENOUS | Status: DC
  Administered 2015-04-23 – 2015-04-24 (×4): 3.375 g via INTRAVENOUS
  Filled 2015-04-23 (×6): qty 50

## 2015-04-23 MED ORDER — HEPARIN SODIUM (PORCINE) 5000 UNIT/ML IJ SOLN
5000.0000 [IU] | Freq: Two times a day (BID) | INTRAMUSCULAR | Status: DC
  Administered 2015-04-24: 5000 [IU] via SUBCUTANEOUS
  Filled 2015-04-23 (×3): qty 1

## 2015-04-23 MED ORDER — FOLIC ACID 5 MG/ML IJ SOLN
1.0000 mg | Freq: Every day | INTRAMUSCULAR | Status: DC
  Administered 2015-04-24 – 2015-05-10 (×14): 1 mg via INTRAVENOUS
  Filled 2015-04-23 (×18): qty 0.2

## 2015-04-23 MED ORDER — LORAZEPAM 2 MG/ML IJ SOLN
2.0000 mg | INTRAMUSCULAR | Status: DC | PRN
  Administered 2015-04-24 – 2015-04-27 (×3): 2 mg via INTRAVENOUS
  Filled 2015-04-23 (×3): qty 1

## 2015-04-23 MED ORDER — SODIUM CHLORIDE 0.45 % IV SOLN
INTRAVENOUS | Status: DC
  Administered 2015-04-23 (×2): via INTRAVENOUS

## 2015-04-23 MED ORDER — CHLORHEXIDINE GLUCONATE 0.12 % MT SOLN
15.0000 mL | Freq: Two times a day (BID) | OROMUCOSAL | Status: DC
  Administered 2015-04-24 – 2015-05-10 (×26): 15 mL via OROMUCOSAL
  Filled 2015-04-23 (×40): qty 15

## 2015-04-23 NOTE — Progress Notes (Signed)
ANTIBIOTIC CONSULT NOTE - INITIAL  Pharmacy Consult for vancomycin Indication: rule out sepsis  No Known Allergies  Patient Measurements: Height: 6' (182.9 cm) Weight: 120 lb (54.432 kg) IBW/kg (Calculated) : 77.6  Vital Signs: Temp: 95.5 F (35.3 C) (07/05 0215) Temp Source: Axillary (07/05 0054) BP: 114/59 mmHg (07/05 0215) Pulse Rate: 87 (07/05 0215)  Labs:  Recent Labs  04/22/15 2145  WBC 8.6  HGB 9.9*  PLT 151  CREATININE 1.31*   Estimated Creatinine Clearance: 29.4 mL/min (by C-G formula based on Cr of 1.31).  Medical History: Past Medical History  Diagnosis Date  . Glaucoma   . Blind      Assessment: 81yo male presents w/ AMS after steady decline in health over last few weeks, became more responsive w/ warming and fluids, UA abnormal, concern for sepsis, to begin IV ABX.  Goal of Therapy:  Vancomycin trough level 15-20 mcg/ml  Plan:  Rec'd vanc 1g in ED; will continue with vancomycin 500mg  IV Q24H and monitor CBC, Cx, levels prn.  Note MD has ordered Zosyn 2.25g IV Q8H, will change to 3.375g for full coverage w/ current renal function.  Vernard GamblesVeronda Ricardo Schubach, PharmD, BCPS  04/23/2015,2:25 AM

## 2015-04-23 NOTE — Progress Notes (Signed)
Utilization review completed. Goddess Gebbia, RN, BSN. 

## 2015-04-23 NOTE — ED Notes (Signed)
Admitting MD at bedside.

## 2015-04-23 NOTE — Progress Notes (Signed)
[facility] TEAM 1 - Stepdown/ICU TEAM Progress Note  Greg Scott:[id number] DOB: 25-Mar-1923 DOA: 04/22/2015 PCP: No primary care provider on file.  Admit HPI / Brief Narrative: Greg Scott is a 81 y.o. BM PMHx glaucoma/ blindness, lives at home with family. Has been doing more poorly over the last few months in terms of mobility. Pt's PCP thought that he might have a "pinched nerve" in his back causing bilat LE weakness and frequent falls that the pt was having. In the midst of these issues, the son went away for the weekend and a caretaker was supposed to look after Mr Greg Scott. On returning they found the pt on the floor with dried feces/ urine, poorly responsive to brought to ED.   In ED pt was hypothermic, BP 127/86, HR 88, temp 95.2. He looked dehydrated and has rec'd about 4L IVF's so far. Na 146 CO2 23 BUN 35 Creat 1.31. Alb 2.5, Ca 8.1, total bili 1.7, NH3 16. 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 Son states that his father has been drinking~a half gallon of rum per week. States baseline is patient is able to communicate appropriately, at times can feed himself. Son states that starting~1 month ago patient began to have multiple episodes of falling. Patient saw his PCP who felt it was a lower spinal problem. Patient states he found his father down and believes patient was down for approximately 48 hours.   Assessment/Plan: Syncope and collapse -Likely multifactorial to include UTI, alcohol abuse (possible seizure), stroke?, MI ?, -Patient down for unknown amount time. Will initiate syncope workup -Echocardiogram pending -Troponin 3 pending -Brain MRI/MRA pending -Will need to R/O  rhabdomyolysis -TSH pending -Sensitive SSI  Alcohol abuse -Alcohol level pending -Rapid urine drug screen pending -Start CIWA protocol -Banana bag at 100 ml/hr -Continue 0.45% saline at 48ml/hr  AMS -See syncope and fall  Urinary tract infection with hematuria -Patient was started on Zosyn + vancomycin  Rib fracture -Remote right fifth and sixth rib fractures; monitor for now  Hypokalemia; -Potassium 10 mEq 3 runs -Magnesium pending    Code Status: FULL Family Communication: Son present at time of exam Disposition Plan:    Consultants: NA  Procedure/Significant Events: 7/4 CT head without contrast;No acute intracranial pathology -Moderate cortical volume loss and scattered small vessel ischemic microangiopathy. 7/5 MRI brain without contrast; -moderate atrophy. -Negative for acute infarct or mass.     Culture 7/4 blood left arm/hand NGTD 7/4 urine NGTD 7/5 MRSA by PCR negative   Antibiotics: Zosyn 7/4>> Vancomycin 7/4>>  DVT prophylaxis: Heparin subcutaneous   Devices    LINES / TUBES:      Continuous Infusions: . sodium chloride 25 mL/hr at 04/23/15 2138    Objective: VITAL SIGNS: Temp: 99 F (37.2 C) (07/05 1915) Temp Source: Oral (07/05 1915) BP: 121/64 mmHg (07/05 1919) Pulse Rate: 87 (07/05 1919) SPO2; FIO2:   Intake/Output Summary (Last 24 hours) at 04/23/15 2150 Last data filed at 04/23/15 0900  Gross per 24 hour  Intake 4397.92 ml  Output    100 ml  Net 4297.92 ml     Exam: General: A/O 0, obtunded, moans, no response to painful stimuli, occasionally  spontaneously moves extremities No acute respiratory distress Eyes:  retinal hemorrhage ENT: Negative Runny nose,  Neck:  Negative scars, masses, torticollis, lymphadenopathy, JVD Lungs: Clear to auscultation bilaterally without wheezes or crackles Cardiovascular: Tachycardic, Regular rhythm without murmur gallop or rub normal S1 and S2 Abdomen:negative abdominal  pain, negative dysphagia, Nontender, nondistended, soft, bowel sounds positive, no rebound, no ascites, no appreciable mass Extremities: No significant cyanosis, clubbing, or edema bilateral lower extremities Psychiatric:  Unable to assess Neurologic:  Patient withdraws lower extremities to painful  stimuli    Data Reviewed: Basic Metabolic Panel:  Recent Labs Lab 04/22/15 2145 04/23/15 0335  NA 146* 145  K 3.2* 3.1*  CL 110 111  CO2 23 20*  GLUCOSE 151* 155*  BUN 35* 30*  CREATININE 1.31* 1.20  CALCIUM 8.1* 7.7*   Liver Function Tests:  Recent Labs Lab 04/22/15 2145  AST 46*  ALT 36  ALKPHOS 50  BILITOT 1.7*  PROT 5.2*  ALBUMIN 2.5*   No results for input(s): LIPASE, AMYLASE in the last 168 hours.  Recent Labs Lab 04/22/15 2145  AMMONIA 16   CBC:  Recent Labs Lab 04/22/15 2145 04/23/15 0335  WBC 8.6 10.5  NEUTROABS 8.0*  --   HGB 9.9* 9.8*  HCT 29.0* 27.7*  MCV 97.6 94.2  PLT 151 144*   Cardiac Enzymes:  Recent Labs Lab 04/22/15 2145  CKTOTAL 533*   BNP (last 3 results) No results for input(s): BNP in the last 8760 hours.  ProBNP (last 3 results) No results for input(s): PROBNP in the last 8760 hours.  CBG:  Recent Labs Lab 04/22/15 2148 04/23/15 0325 04/23/15 0753 04/23/15 1102 04/23/15 1917  GLUCAP 140* 135* 141* 137* 99    Recent Results (from the past 240 hour(s))  Blood culture (routine x 2)     Status: None (Preliminary result)   Collection Time: 04/22/15 10:00 PM  Result Value Ref Range Status   Specimen Description BLOOD LEFT ARM  Final   Special Requests   Final    BOTTLES DRAWN AEROBIC AND ANAEROBIC BLUE 10CC, RED 5CC   Culture NO GROWTH < 24 HOURS  Final   Report Status PENDING  Incomplete  Blood culture (routine x 2)     Status: None (Preliminary result)   Collection Time: 04/22/15 10:05 PM  Result Value Ref Range Status   Specimen Description BLOOD LEFT HAND  Final   Special Requests BOTTLES DRAWN AEROBIC ONLY  10CC  Final   Culture NO GROWTH < 24 HOURS  Final   Report Status PENDING  Incomplete  Urine culture     Status: None (Preliminary result)   Collection Time: 04/22/15 10:15 PM  Result Value Ref Range Status   Specimen Description URINE, CATHETERIZED  Final   Special Requests NONE  Final   Culture NO GROWTH < 12 HOURS  Final   Report Status PENDING  Incomplete  MRSA PCR Screening     Status: None   Collection Time: 04/23/15  4:12 AM  Result Value Ref Range Status   MRSA by PCR NEGATIVE NEGATIVE Final    Comment:        The GeneXpert MRSA Assay (FDA approved for NASAL specimens only), is one component of a comprehensive MRSA colonization surveillance program. It is not intended to diagnose MRSA infection nor to guide or monitor treatment for MRSA infections.      Studies:  Recent x-ray studies have been reviewed in detail by the Attending Physician  Scheduled Meds:  Scheduled  Meds: . antiseptic oral rinse  7 mL Mouth Rinse q12n4p  . chlorhexidine  15 mL Mouth Rinse BID  . [START ON 04/24/2015] folic acid  1 mg Intravenous Daily  . [START ON 04/24/2015] heparin  5,000 Units Subcutaneous Q12H  . insulin aspart  0-9 Units Subcutaneous 6 times per day  . piperacillin-tazobactam (ZOSYN)  IV  3.375 g Intravenous Q8H  . [START ON 04/24/2015] pneumococcal 23 valent vaccine  0.5 mL Intramuscular Tomorrow-1000  . potassium chloride  10 mEq Intravenous Q1 Hr x 3  . banana bag IV 1000 mL   Intravenous Once  . [START ON 04/24/2015] thiamine  100 mg Intravenous Daily  . [START ON 04/24/2015] vancomycin  500 mg Intravenous Q24H    Time spent on care of this patient: 40 mins   Jalesha Plotz, Roselind Messier , MD  [facility] Office  [phone] Pager - [phone]  On-Call/Text Page:      [email or web address]      password [email or web address]  If 7PM-7AM, please contact night-coverage [email or web address] Password [email or web address] 04/23/2015, 9:50 PM   LOS: 0 days   Care during the described time interval was provided by me .  I  have reviewed this patient's available data, including medical history, events of note, physical examination, and all test results as part of my evaluation. I have personally reviewed and interpreted all radiology studies.   Carolyne Littles, MD [phone] Pager

## 2015-04-23 NOTE — Progress Notes (Signed)
Initial Nutrition Assessment  DOCUMENTATION CODES:  Underweight  INTERVENTION:   (Advance diet as medically appropriate, RD to add interventions accordingly)  NUTRITION DIAGNOSIS:  Inadequate oral intake related to inability to eat as evidenced by NPO status  GOAL:  Patient will meet greater than or equal to 90% of their needs  MONITOR:  Diet advancement, PO intake, Labs, Weight trends, Skin, I & O's  REASON FOR ASSESSMENT:  Low Braden  ASSESSMENT: 81 y.o. Male with hx glaucoma/ blindness, lives at home with family. Has been doing more poorly over the last few months in terms of mobility. Pt's PCP thought that he might have a "pinched nerve" in his back causing bilat LE weakness and frequent falls that the pt was having. In the midst of these issues, the son went away for the weekend and a caretaker was supposed to look after [name]. On returning they found the pt on the floor with dried feces/ urine, poorly responsive to brought to ED.   RD unable to obtain nutrition hx or complete Nutrition Focused Physical Exam.   Low braden score places patient at risk for further skin breakdown.  RD suspects malnutrition, however, unable to identify at this time.  Height:  Ht Readings from Last 1 Encounters:  04/23/15 5\' 11"  (1.803 m)    Weight:  Wt Readings from Last 1 Encounters:  04/23/15 124 lb 5.4 oz (56.4 kg)    Ideal Body Weight:  78.1 kg  Wt Readings from Last 10 Encounters:  04/23/15 124 lb 5.4 oz (56.4 kg)    BMI:  Body mass index is 17.35 kg/(m^2).  Estimated Nutritional Needs:  Kcal:  1600-1800  Protein:  80-90 gm  Fluid:  1.6-1.8 L  Skin:  Wound (see comment) (Stage I to sacrum)  Diet Order:  Diet NPO time specified  EDUCATION NEEDS:  No education needs identified at this time   Intake/Output Summary (Last 24 hours) at 04/23/15 1459 Last data filed at 04/23/15 0900  Gross per 24 hour  Intake 4397.92 ml  Output    100 ml  Net 4297.92 ml     Last BM:  Unknown  Maureen ChattersKatie Kao Berkheimer, RD, LDN Pager #: [phone] After-Hours Pager #: [phone]

## 2015-04-23 NOTE — H&P (Addendum)
[facility] History and Physical  Greg Scott GEX:[id number] DOB: Sep 26, 1923 DOA: 04/22/2015  Referring physician: ER MD PCP: No primary care provider on file.   Chief Complaint: Altered mental status  HPI: Greg Scott is a 81 y.o. male with hx glaucoma/ blindness, lives at home with family. Has been doing more poorly over the last few months in terms of mobility. Pt's PCP thought that he might have a "pinched nerve" in his back causing bilat LE weakness and frequent falls that the pt was having. In the midst of these issues, the son went away for the weekend and a caretaker was supposed to look after Greg Scott.  On returning they found the pt on the floor with dried feces/ urine, poorly responsive to brought to ED.   In ED pt was hypothermic, BP 127/86, HR 88, temp 95.2.  He looked dehydrated and has rec'd about 4L IVF's so far.  Na 146  CO2 23  BUN 35  Creat 1.31.  Alb 2.5, Ca 8.1, total bili 1.7, NH3 16. Lactic acid 2.1, WBC 8k, Hb 9.9, Plt 151 Trop 0.04 UA large small LE/ pos nitrite/ 3-6 wbc's CT head showed some small WM changes, no acute CVA, no skull fracture  Review of Systems: As presented in the history of presenting illness, rest negative.  Past Medical History  Diagnosis Date  . Glaucoma   . Blind    History reviewed. No pertinent past surgical history. Social History:  has no tobacco, alcohol, and drug history on file. Where does patient live - at home with his family Can patient participate in ADLs? minimally  No Known Allergies  Family History: History reviewed. No pertinent family history.  Prior to Admission medications   Medication Sig Start Date End Date Taking? Authorizing Provider  latanoprost (XALATAN) 0.005 % ophthalmic solution Place 1 drop into both eyes at bedtime.   Yes Historical Provider, MD    Physical Exam: Filed Vitals:   04/23/15 0100 04/23/15 0115 04/23/15 0130 04/23/15 0145  BP: 127/63 130/63 130/63 123/81  Pulse: 89 88 87 87  Temp: 95.2  F (35.1 C) 95.5 F (35.3 C) 95.5 F (35.3 C) 95.5 F (35.3 C)  TempSrc:      Resp: Height:      Weight:      SpO2: 100% 100% 100% 100%      Labs on Admission:  Basic Metabolic Panel:  Recent Labs Lab 04/22/15 2145  NA 146*  K 3.2*  CL 110  CO2 23  GLUCOSE 151*  BUN 35*  CREATININE 1.31*  CALCIUM 8.1*   Liver Function Tests:  Recent Labs Lab 04/22/15 2145  AST 46*  ALT 36  ALKPHOS 50  BILITOT 1.7*  PROT 5.2*  ALBUMIN 2.5*   No results for input(s): LIPASE, AMYLASE in the last 168 hours.  Recent Labs Lab 04/22/15 2145  AMMONIA 16   CBC:  Recent Labs Lab 04/22/15 2145  WBC 8.6  NEUTROABS 8.0*  HGB 9.9*  HCT 29.0*  MCV 97.6  PLT 151   Cardiac Enzymes:  Recent Labs Lab 04/22/15 2145  CKTOTAL 533*    BNP (last 3 results) No results for input(s): BNP in the last 8760 hours.  ProBNP (last 3 results) No results for input(s): PROBNP in the last 8760 hours.  CBG:  Recent Labs Lab 04/22/15 2148  GLUCAP 140*    Radiological Exams on Admission: Ct Head Wo Contrast  04/23/2015   CLINICAL DATA:  Found unresponsive. Altered mental status. Initial encounter.  EXAM: CT HEAD WITHOUT CONTRAST  TECHNIQUE: Contiguous axial images were obtained from the base of the skull through the vertex without intravenous contrast.  COMPARISON:  CT of the head performed 11/01/2014  FINDINGS: There is no evidence of acute infarction, mass lesion, or intra- or extra-axial hemorrhage on CT.  Prominence of the ventricles and sulci reflects moderate cortical volume loss. Mild cerebellar atrophy is noted. Scattered periventricular and subcortical white matter change likely reflects small vessel ischemic microangiopathy.  The brainstem and fourth ventricle are within normal limits. The basal ganglia are unremarkable in appearance. The cerebral hemispheres demonstrate grossly normal gray-white differentiation. No mass effect or midline shift is seen.  There is no  evidence of fracture; visualized osseous structures are unremarkable in appearance. Postoperative change is noted at the optic globes bilaterally. The orbits are within normal limits. The paranasal sinuses and mastoid air cells are well-aerated. Mild soft tissue swelling is noted at the right anterior and left posterior vertex.  IMPRESSION: 1. No acute intracranial pathology seen on CT. 2. Mild soft tissue swelling at the right anterior and left posterior vertex. 3. Moderate cortical volume loss and scattered small vessel ischemic microangiopathy.   Electronically Signed   By: Roanna RaiderJeffery  Chang M.D.   On: 04/23/2015 00:24   Dg Chest Portable 1 View  04/22/2015   CLINICAL DATA:  Altered mental status after being found down  EXAM: PORTABLE CHEST - 1 VIEW  COMPARISON:  11/13/2008  FINDINGS: Normal heart size and mediastinal contours.  No acute infiltrate or edema. No effusion or pneumothorax. Probable nipple shadow on the left.  Remote right fifth and sixth rib fractures. No acute osseous findings.  IMPRESSION: 1. No active disease. 2. Nodular density at the left base is likely a nipple shadow. Two-view chest with nipple markers could confirm if clinically appropriate.   Electronically Signed   By: Marnee SpringJonathon  Watts M.D.   On: 04/22/2015 21:36    EKG: Independently reviewed. Shows old anteroseptal MI w qwaves in V1, V2. NSR, no acute changes.   Assessment/Plan Principal Problem:   Altered mental status Active Problems:   Failure to thrive in adult   Pyuria   Volume depletion   Blind   Glaucoma   Assessment: 1. AMS - due to severe dehydration, probably also infection somewhere.  Hydrate, abx for poss UTI 2. Pyuria - r/o UTI 3. Blind / glaucoma 4. Vol depletion - IVF's 5. Failure to thrive  Plan - IVF's, IV abx, f/u cultures, await return of MS (good baseline at least mentally per the son).   DVT Prophylaxis sq heparin bid 5000  Code Status: full (reviewed w pt's son)  Family Communication: d/w  son in the room  Disposition Plan: home when stable    Kathrynne Kulinski D [facility]  If 7PM-7AM, please contact night-coverage [email or web address] Password [email or web address] 04/23/2015, 2:11 AM

## 2015-04-24 ENCOUNTER — Inpatient Hospital Stay (HOSPITAL_COMMUNITY): Payer: Medicare PPO

## 2015-04-24 DIAGNOSIS — L8990 Pressure ulcer of unspecified site, unspecified stage: Secondary | ICD-10-CM | POA: Diagnosis present

## 2015-04-24 DIAGNOSIS — N3001 Acute cystitis with hematuria: Secondary | ICD-10-CM | POA: Diagnosis not present

## 2015-04-24 DIAGNOSIS — R55 Syncope and collapse: Secondary | ICD-10-CM

## 2015-04-24 LAB — BASIC METABOLIC PANEL
Anion gap: 9 (ref 5–15)
BUN: 27 mg/dL — ABNORMAL HIGH (ref 6–20)
CO2: 23 mmol/L (ref 22–32)
Calcium: 7.6 mg/dL — ABNORMAL LOW (ref 8.9–10.3)
Chloride: 113 mmol/L — ABNORMAL HIGH (ref 101–111)
Creatinine, Ser: 1.34 mg/dL — ABNORMAL HIGH (ref 0.61–1.24)
GFR, EST AFRICAN AMERICAN: 52 mL/min — AB (ref >60)
GFR, EST NON AFRICAN AMERICAN: 45 mL/min — AB (ref >60)
GLUCOSE: 91 mg/dL (ref 65–99)
Potassium: 3.4 mmol/L — ABNORMAL LOW (ref 3.5–5.1)
SODIUM: 145 mmol/L (ref 135–145)

## 2015-04-24 LAB — URINE CULTURE: Culture: 1000

## 2015-04-24 LAB — GLUCOSE, CAPILLARY
GLUCOSE-CAPILLARY: 82 mg/dL (ref 65–99)
Glucose-Capillary: 78 mg/dL (ref 65–99)
Glucose-Capillary: 71 mg/dL (ref 65–99)
Glucose-Capillary: 89 mg/dL (ref 65–99)
Glucose-Capillary: 71 mg/dL (ref 65–99)

## 2015-04-24 LAB — TROPONIN I
TROPONIN I: 0.06 ng/mL — AB (ref <0.031)
Troponin I: 0.07 ng/mL — ABNORMAL HIGH (ref <0.031)

## 2015-04-24 MED ORDER — POTASSIUM CHLORIDE IN NACL 20-0.9 MEQ/L-% IV SOLN
INTRAVENOUS | Status: DC
  Administered 2015-04-24: 75 mL/h via INTRAVENOUS
  Filled 2015-04-24 (×3): qty 1000

## 2015-04-24 MED ORDER — ENOXAPARIN SODIUM 40 MG/0.4ML ~~LOC~~ SOLN
40.0000 mg | SUBCUTANEOUS | Status: DC
  Administered 2015-04-24 – 2015-04-25 (×2): 40 mg via SUBCUTANEOUS
  Filled 2015-04-24 (×2): qty 0.4

## 2015-04-24 MED ORDER — LATANOPROST 0.005 % OP SOLN
1.0000 [drp] | Freq: Every day | OPHTHALMIC | Status: DC
Start: 2015-04-24 — End: 2015-05-10
  Administered 2015-04-24 – 2015-05-09 (×13): 1 [drp] via OPHTHALMIC
  Filled 2015-04-24: qty 2.5

## 2015-04-24 MED ORDER — CEFTRIAXONE SODIUM IN DEXTROSE 20 MG/ML IV SOLN
1.0000 g | INTRAVENOUS | Status: DC
  Administered 2015-04-24 – 2015-04-27 (×4): 1 g via INTRAVENOUS
  Filled 2015-04-24 (×5): qty 50

## 2015-04-24 NOTE — Progress Notes (Signed)
Echocardiogram 2D Echocardiogram has been performed.  Greg SavoyCasey N Scott Murri 04/24/2015, 9:39 AM

## 2015-04-24 NOTE — Progress Notes (Signed)
[facility] TEAM 1 - Stepdown/ICU TEAM Progress Note  MCCRAE SPECIALE KGM:[id number] DOB: Jan 17, 1923 DOA: 04/22/2015 PCP: No primary care provider on file.  Admit HPI / Brief Narrative: 81 y.o. M Hx glaucoma/ blindness, lives at home with family. Has been doing more poorly over the last few months in terms of mobility. Pt's PCP thought that he might have a "pinched nerve" in his back causing bilat LE weakness and frequent falls. In the midst of these issues, the son went away for the weekend and a caretaker was supposed to look after Mr Greg Scott. On returning they found the pt on the floor with dried feces/ urine, poorly responsive.  He was brought to the ED.   In ED pt was hypothermic, BP 127/86, HR 88, temp 95.2. He looked dehydrated and rec'd about 4L IVF. Na 146 CO2 23 BUN 35 Creat 1.31. Alb 2.5, Ca 8.1, total bili 1.7, NH3 16. Lactic acid 2.1, WBC 8k, Hb 9.9, Plt 151  Trop 0.04  UA large small LE/ pos nitrite/ 3-6 wbc's  CT head showed some small WM changes, no acute CVA, no skull fracture  HPI/Subjective: Per son the pt has been having episodes of depression starting ~3 months ago, secondary to now being the sole remaining child. Son stated that his father had been drinking ~ a half gallon of rum per week. States baseline is patient is able to communicate appropriately, at times can feed himself. Son states that starting~1 month ago patient began to have multiple episodes of falling.   At the time of my visit today the patient is sedated with Ativan as per the CIWA protocol.  His respirations are comfortable and he does not appear to be in distress.  Assessment/Plan:  Syncope and collapse - encephalopathy -Likely multifactorial to include UTI, alcohol abuse with probable withdrawal, ?seizure -MRI w/o evidence of CVA -Echocardiogram pending -Troponin not significantly elevated  -TSH normal  -Continue supportive care  Alcohol abuse -Alcohol level negative - perhaps patient's presentation  was due to acute alcohol withdrawal -urine drug screen negative  -CIWA protocol to continue  Urinary tract infection with hematuria -Patient was started on Zosyn + vancomycin - no reason to be suspicious of MRSA  - narrow antibiotics to empiric Rocephin alone and follow culture   Rib fracture -Remote right fifth and sixth rib fractures; monitor for now - consistent with frequent falls and alcoholism  Hypokalemia -Recheck and replace as needed with goal of 4.0 - magnesium acceptable at this time  Code Status: FULL Family Communication: No family present at time of exam  Disposition Plan: SDU  Consultants: NA  Procedure/Significant Events: 7/4 CT head without contrast;No acute intracranial pathology - Moderate cortical volume loss and scattered small vessel ischemic microangiopathy. 7/5 MRI brain without contrast -moderate atrophy -Negative for acute infarct or mass. Chronic micro hemorrhage right frontal white matter  Antibiotics: Zosyn 7/4 > 7/6 Vancomycin 7/4 > 7/6 Rocephin 7/6 >  DVT prophylaxis: lovenox  Objective: Blood pressure 104/59, pulse 81, temperature 97 F (36.1 C), temperature source Axillary, resp. rate 12, height  (1.803 m), weight 56.4 kg (124 lb 5.4 oz), SpO2 100 %.  Intake/Output Summary (Last 24 hours) at 04/24/15 1044 Last data filed at 04/24/15 0731  Gross per 24 hour  Intake 2254.16 ml  Output    500 ml  Net 1754.16 ml   Exam: General: No acute respiratory distress - obtunded  Lungs: Clear to auscultation bilaterally without wheezes or crackles Cardiovascular: Regular rate and  rhythm without murmur gallop or rub normal S1 and S2 Abdomen: Nontender, nondistended, soft, bowel sounds positive, no rebound, no ascites, no appreciable mass Extremities: No significant cyanosis, or clubbing, trace edema bilateral lower extremities   Data Reviewed: Basic Metabolic Panel:  Recent Labs Lab 04/22/15 2145 04/23/15 0335 04/23/15 2142  NA 146*  145  --   K 3.2* 3.1*  --   CL 110 111  --   CO2 23 20*  --   GLUCOSE 151* 155*  --   BUN 35* 30*  --   CREATININE 1.31* 1.20  --   CALCIUM 8.1* 7.7*  --   MG  --   --  1.7   Liver Function Tests:  Recent Labs Lab 04/22/15 2145  AST 46*  ALT 36  ALKPHOS 50  BILITOT 1.7*  PROT 5.2*  ALBUMIN 2.5*    Recent Labs Lab 04/22/15 2145  AMMONIA 16   CBC:  Recent Labs Lab 04/22/15 2145 04/23/15 0335  WBC 8.6 10.5  NEUTROABS 8.0*  --   HGB 9.9* 9.8*  HCT 29.0* 27.7*  MCV 97.6 94.2  PLT 151 144*   Cardiac Enzymes:  Recent Labs Lab 04/22/15 2145 04/23/15 2142 04/24/15 0310 04/24/15 0735  CKTOTAL 533* 323  --   --   TROPONINI  --  0.06* 0.07* 0.06*   CBG:  Recent Labs Lab 04/23/15 1102 04/23/15 1917 04/23/15 2250 04/24/15 0433 04/24/15 0729  GLUCAP 137* 99 92 82 78    Recent Results (from the past 240 hour(s))  Blood culture (routine x 2)     Status: None (Preliminary result)   Collection Time: 04/22/15 10:00 PM  Result Value Ref Range Status   Specimen Description BLOOD LEFT ARM  Final   Special Requests   Final    BOTTLES DRAWN AEROBIC AND ANAEROBIC BLUE 10CC, RED 5CC   Culture NO GROWTH < 24 HOURS  Final   Report Status PENDING  Incomplete  Blood culture (routine x 2)     Status: None (Preliminary result)   Collection Time: 04/22/15 10:05 PM  Result Value Ref Range Status   Specimen Description BLOOD LEFT HAND  Final   Special Requests BOTTLES DRAWN AEROBIC ONLY 10CC  Final   Culture NO GROWTH < 24 HOURS  Final   Report Status PENDING  Incomplete  Urine culture     Status: None   Collection Time: 04/22/15 10:15 PM  Result Value Ref Range Status   Specimen Description URINE, CATHETERIZED  Final   Special Requests NONE  Final   Culture 1,000 COLONIES/mL INSIGNIFICANT GROWTH  Final   Report Status 04/24/2015 FINAL  Final  MRSA PCR Screening     Status: None   Collection Time: 04/23/15  4:12 AM  Result Value Ref Range Status   MRSA by PCR  NEGATIVE NEGATIVE Final    Comment:        The GeneXpert MRSA Assay (FDA approved for NASAL specimens only), is one component of a comprehensive MRSA colonization surveillance program. It is not intended to diagnose MRSA infection nor to guide or monitor treatment for MRSA infections.      Studies:  Recent x-ray studies have been reviewed in detail by the Attending Physician  Scheduled Meds:  Scheduled Meds: . antiseptic oral rinse  7 mL Mouth Rinse q12n4p  . chlorhexidine  15 mL Mouth Rinse BID  . folic acid  1 mg Intravenous Daily  . heparin  5,000 Units Subcutaneous Q12H  . insulin aspart  0-9 Units Subcutaneous 6 times per day  . piperacillin-tazobactam (ZOSYN)  IV  3.375 g Intravenous Q8H  . pneumococcal 23 valent vaccine  0.5 mL Intramuscular Tomorrow-1000  . thiamine  100 mg Intravenous Daily  . vancomycin  500 mg Intravenous Q24H    Time spent on care of this patient: 35 mins  Lonia BloodJeffrey T. Khylie Larmore, MD [facility] For Consults/Admissions - Flow Manager - [phone] Office  [phone]  Contact MD directly via text page:      [email or web address]      password [email or web address]  04/24/2015, 10:44 AM   LOS: 1 day

## 2015-04-25 DIAGNOSIS — N390 Urinary tract infection, site not specified: Secondary | ICD-10-CM | POA: Diagnosis present

## 2015-04-25 DIAGNOSIS — H540 Blindness, both eyes: Secondary | ICD-10-CM

## 2015-04-25 DIAGNOSIS — S2231XS Fracture of one rib, right side, sequela: Secondary | ICD-10-CM

## 2015-04-25 DIAGNOSIS — H409 Unspecified glaucoma: Secondary | ICD-10-CM

## 2015-04-25 DIAGNOSIS — R400 Somnolence: Secondary | ICD-10-CM

## 2015-04-25 LAB — GLUCOSE, CAPILLARY
GLUCOSE-CAPILLARY: 69 mg/dL (ref 65–99)
GLUCOSE-CAPILLARY: 93 mg/dL (ref 65–99)
Glucose-Capillary: 66 mg/dL (ref 65–99)
Glucose-Capillary: 77 mg/dL (ref 65–99)
Glucose-Capillary: 79 mg/dL (ref 65–99)
Glucose-Capillary: 99 mg/dL (ref 65–99)

## 2015-04-25 LAB — CBC
HCT: 27.8 % — ABNORMAL LOW (ref 39.0–52.0)
Hemoglobin: 9.4 g/dL — ABNORMAL LOW (ref 13.0–17.0)
MCH: 32.5 pg (ref 26.0–34.0)
MCHC: 33.8 g/dL (ref 30.0–36.0)
MCV: 96.2 fL (ref 78.0–100.0)
PLATELETS: 142 10*3/uL — AB (ref 150–400)
RBC: 2.89 MIL/uL — AB (ref 4.22–5.81)
RDW: 13.9 % (ref 11.5–15.5)
WBC: 12.7 10*3/uL — AB (ref 4.0–10.5)

## 2015-04-25 LAB — HEMOGLOBIN A1C
Hgb A1c MFr Bld: 6.4 % — ABNORMAL HIGH (ref 4.8–5.6)
MEAN PLASMA GLUCOSE: 137 mg/dL

## 2015-04-25 LAB — COMPREHENSIVE METABOLIC PANEL
ALBUMIN: 2 g/dL — AB (ref 3.5–5.0)
ALT: 29 U/L (ref 17–63)
AST: 38 U/L (ref 15–41)
Alkaline Phosphatase: 49 U/L (ref 38–126)
Anion gap: 11 (ref 5–15)
BUN: 25 mg/dL — ABNORMAL HIGH (ref 6–20)
CO2: 20 mmol/L — ABNORMAL LOW (ref 22–32)
Calcium: 7.9 mg/dL — ABNORMAL LOW (ref 8.9–10.3)
Chloride: 114 mmol/L — ABNORMAL HIGH (ref 101–111)
Creatinine, Ser: 1.35 mg/dL — ABNORMAL HIGH (ref 0.61–1.24)
GFR calc Af Amer: 52 mL/min — ABNORMAL LOW (ref >60)
GFR calc non Af Amer: 45 mL/min — ABNORMAL LOW (ref >60)
Glucose, Bld: 76 mg/dL (ref 65–99)
POTASSIUM: 3.4 mmol/L — AB (ref 3.5–5.1)
Sodium: 145 mmol/L (ref 135–145)
TOTAL PROTEIN: 5.1 g/dL — AB (ref 6.5–8.1)
Total Bilirubin: 1 mg/dL (ref 0.3–1.2)

## 2015-04-25 LAB — FOLATE: FOLATE: 20.8 ng/mL (ref >5.9)

## 2015-04-25 LAB — VITAMIN B12: VITAMIN B 12: 417 pg/mL (ref 180–914)

## 2015-04-25 LAB — MAGNESIUM: Magnesium: 1.7 mg/dL (ref 1.7–2.4)

## 2015-04-25 LAB — PHOSPHORUS: PHOSPHORUS: 3 mg/dL (ref 2.5–4.6)

## 2015-04-25 MED ORDER — MAGNESIUM SULFATE 2 GM/50ML IV SOLN
2.0000 g | Freq: Once | INTRAVENOUS | Status: AC
  Administered 2015-04-25: 2 g via INTRAVENOUS
  Filled 2015-04-25: qty 50

## 2015-04-25 MED ORDER — ENOXAPARIN SODIUM 30 MG/0.3ML ~~LOC~~ SOLN
30.0000 mg | SUBCUTANEOUS | Status: DC
  Administered 2015-04-26 – 2015-05-09 (×14): 30 mg via SUBCUTANEOUS
  Filled 2015-04-25 (×15): qty 0.3

## 2015-04-25 MED ORDER — KCL IN DEXTROSE-NACL 40-5-0.45 MEQ/L-%-% IV SOLN
INTRAVENOUS | Status: DC
  Administered 2015-04-25 – 2015-04-27 (×4): via INTRAVENOUS
  Filled 2015-04-25 (×9): qty 1000

## 2015-04-25 MED ORDER — INSULIN ASPART 100 UNIT/ML ~~LOC~~ SOLN
0.0000 [IU] | SUBCUTANEOUS | Status: DC
  Administered 2015-04-26 – 2015-05-03 (×11): 1 [IU] via SUBCUTANEOUS
  Administered 2015-05-07: 2 [IU] via SUBCUTANEOUS

## 2015-04-25 NOTE — Progress Notes (Signed)
[facility] TEAM 1 - Stepdown/ICU TEAM Progress Note  Greg Scott ZOX:[id number] DOB: [birth year] DOA: 04/22/2015 PCP: No primary care provider on file.  Admit HPI / Brief Narrative: Greg Scott is a 81 y.o. BM PMHx glaucoma/ blindness, lives at home with family. Has been doing more poorly over the last few months in terms of mobility. Pt's PCP thought that he might have a "pinched nerve" in his back causing bilat LE weakness and frequent falls that the pt was having. In the midst of these issues, the son went away for the weekend and a caretaker was supposed to look after Greg Scott. On returning they found the pt on the floor with dried feces/ urine, poorly responsive to brought to ED.   In ED pt was hypothermic, BP 127/86, HR 88, temp 95.2. He looked dehydrated and has rec'd about 4L IVF's so far. Na 146 CO2 23 BUN 35 Creat 1.31. Alb 2.5, Ca 8.1, total bili 1.7, NH3 16. Lactic acid 2.1, WBC 8k, Hb 9.9, Plt 151 Trop 0.04 UA large small LE/ pos nitrite/ 3-6 wbc's CT head showed some small WM changes, no acute CVA, no skull fracture  HPI/Subjective: 7/7 patient still very sleepy but more more arousable. Will follow some commands. Opens eyes spontaneously. Mumbles answers to questions, but usually inappropriate answers.    Assessment/Plan: Syncope and collapse -Likely multifactorial to include UTI, alcohol abuse (possible seizure); no evidence of stroke or MI -Echocardiogram; see results below -Troponin 3 mildly positive -Brain MRI/MRA; see results below -TSH within normal limit -Continue Sensitive SSI -Obtain EEG  Alcohol abuse -Alcohol level negative - perhaps patient's presentation was due to acute alcohol withdrawal -urine drug screen negative  -Continue CIWA protocol -Patient still not able to consume nutrition; start D5W 0.45%+ saline+ 40 mEq potassium@75  ml/hr  AMS -See syncope and fall  Urinary tract infection with hematuria -Patient was started on Zosyn + vancomycin -  no reason to be suspicious of MRSA  - narrow antibiotics to empiric Rocephin alone and follow culture    Rib fracture -Remote right fifth and sixth rib fractures; monitor for now  Hypokalemia; -Potassium goal> 4 -Potassium 10 mEq 3 runs   Hypomagnesemia  -Magnesium goal > 2  -2 g magnesium     Code Status: FULL Family Communication: Son present at time of exam Disposition Plan:    Consultants: NA  Procedure/Significant Events: 7/4 CT head without contrast;No acute intracranial pathology -Moderate cortical volume loss and scattered small vessel ischemic microangiopathy. 7/5 MRI brain without contrast; -moderate atrophy. -Negative for acute infarct or mass.  7/6 echocardiogram;- LVEF= 55%- 60%. Hypokinesis of the mid-apicalanteroseptal myocardium.    Culture 7/4 blood left arm/hand NGTD 7/4 urine NGTD 7/5 MRSA by PCR negative   Antibiotics: Zosyn 7/4>> stopped 7/6 Vancomycin 7/4>> stopped 7/6  Rocephin 7/6>>   DVT prophylaxis: Heparin subcutaneous   Devices    LINES / TUBES:      Continuous Infusions: . dextrose 5 % and 0.45 % NaCl with KCl 40 mEq/L 75 mL/hr at 04/25/15 1349    Objective: VITAL SIGNS: Temp: 97.3 F (36.3 C) (07/07 1938) Temp Source: Axillary (07/07 1938) BP: 129/64 mmHg (07/07 1938) Pulse Rate: 76 (07/07 1938) SPO2; FIO2:   Intake/Output Summary (Last 24 hours) at 04/25/15 1944 Last data filed at 04/25/15 1943  Gross per 24 hour  Intake 1548.75 ml  Output   1100 ml  Net 448.75 ml     Exam: General: very sleepy but more more arousable.  Will follow some commands. Opens eyes spontaneously, No acute respiratory distress Eyes:  retinal hemorrhage ENT: Negative Runny nose,  Neck:  Negative scars, masses, torticollis, lymphadenopathy, JVD Lungs: Clear to auscultation bilaterally without wheezes or crackles Cardiovascular:  Regular  rate and rhythm without murmur gallop or rub normal S1 and S2 Abdomen:negative  abdominal pain, negative dysphagia, Nontender, nondistended, soft, bowel sounds positive, no rebound, no ascites, no appreciable mass Extremities: No significant cyanosis, clubbing, or edema bilateral lower extremities Psychiatric:  Unable to assess Neurologic:  Patient withdraws lower extremities to painful  stimuli    Data Reviewed: Basic Metabolic Panel:  Recent Labs Lab 04/22/15 2145 04/23/15 0335 04/23/15 2142 04/24/15 1252 04/25/15 0220  NA 146* 145  --  145 145  K 3.2* 3.1*  --  3.4* 3.4*  CL 110 111  --  113* 114*  CO2 23 20*  --  23 20*  GLUCOSE 151* 155*  --  91 76  BUN 35* 30*  --  27* 25*  CREATININE 1.31* 1.20  --  1.34* 1.35*  CALCIUM 8.1* 7.7*  --  7.6* 7.9*  MG  --   --  1.7  --  1.7  PHOS  --   --   --   --  3.0   Liver Function Tests:  Recent Labs Lab 04/22/15 2145 04/25/15 0220  AST 46* 38  ALT 36 29  ALKPHOS 50 49  BILITOT 1.7* 1.0  PROT 5.2* 5.1*  ALBUMIN 2.5* 2.0*   No results for input(s): LIPASE, AMYLASE in the last 168 hours.  Recent Labs Lab 04/22/15 2145  AMMONIA 16   CBC:  Recent Labs Lab 04/22/15 2145 04/23/15 0335 04/25/15 0220  WBC 8.6 10.5 12.7*  NEUTROABS 8.0*  --   --   HGB 9.9* 9.8* 9.4*  HCT 29.0* 27.7* 27.8*  MCV 97.6 94.2 96.2  PLT 151 144* 142*   Cardiac Enzymes:  Recent Labs Lab 04/22/15 2145 04/23/15 2142 04/24/15 0310 04/24/15 0735  CKTOTAL 533* 323  --   --   TROPONINI  --  0.06* 0.07* 0.06*   BNP (last 3 results) No results for input(s): BNP in the last 8760 hours.  ProBNP (last 3 results) No results for input(s): PROBNP in the last 8760 hours.  CBG:  Recent Labs Lab 04/24/15 2347 04/25/15 0329 04/25/15 0812 04/25/15 1200 04/25/15 1533  GLUCAP 79 77 69 66 93    Recent Results (from the past 240 hour(s))  Blood culture (routine x 2)     Status: None (Preliminary result)   Collection Time: 04/22/15 10:00 PM  Result Value Ref Range Status   Specimen Description BLOOD LEFT ARM  Final     Special Requests   Final    BOTTLES DRAWN AEROBIC AND ANAEROBIC BLUE 10CC, RED 5CC   Culture NO GROWTH 3 DAYS  Final   Report Status PENDING  Incomplete  Blood culture (routine x 2)     Status: None (Preliminary result)   Collection Time: 04/22/15 10:05 PM  Result Value Ref Range Status   Specimen Description BLOOD LEFT HAND  Final   Special Requests BOTTLES DRAWN AEROBIC ONLY 10CC  Final   Culture NO GROWTH 3 DAYS  Final   Report Status PENDING  Incomplete  Urine culture     Status: None   Collection Time: 04/22/15 10:15 PM  Result Value Ref Range Status   Specimen Description URINE, CATHETERIZED  Final   Special Requests NONE  Final   Culture 1,000  COLONIES/mL INSIGNIFICANT GROWTH  Final   Report Status 04/24/2015 FINAL  Final  MRSA PCR Screening     Status: None   Collection Time: 04/23/15  4:12 AM  Result Value Ref Range Status   MRSA by PCR NEGATIVE NEGATIVE Final    Comment:        The GeneXpert MRSA Assay (FDA approved for NASAL specimens only), is one component of a comprehensive MRSA colonization surveillance program. It is not intended to diagnose MRSA infection nor to guide or monitor treatment for MRSA infections.      Studies:  Recent x-ray studies have been reviewed in detail by the Attending Physician  Scheduled Meds:  Scheduled Meds: . antiseptic oral rinse  7 mL Mouth Rinse q12n4p  . cefTRIAXone (ROCEPHIN)  IV  1 g Intravenous Q24H  . chlorhexidine  15 mL Mouth Rinse BID  . [START ON 04/26/2015] enoxaparin (LOVENOX) injection  30 mg Subcutaneous Q24H  . folic acid  1 mg Intravenous Daily  . insulin aspart  0-9 Units Subcutaneous 6 times per day  . latanoprost  1 drop Both Eyes QHS  . magnesium sulfate 1 - 4 g bolus IVPB  2 g Intravenous Once  . thiamine  100 mg Intravenous Daily    Time spent on care of this patient: 40 mins   Analis Distler, Roselind MessierURTIS J , MD  [facility] Office  [phone] Pager [phone]  On-Call/Text Page:       [email or web address] [email or web address]      password [email or web address]  If 7PM-7AM, please contact night-coverage [email or web address] Password [email or web address] 04/25/2015, 7:44 PM   LOS: 2 days   Care during the described time interval was provided by me .  I have reviewed this patient's available data, including medical history, events of note, physical examination, and all test results as part of my evaluation. I have personally reviewed and interpreted all radiology studies.   Carolyne Littlesurtis Zuhair Lariccia, MD [phone] Pager

## 2015-04-25 NOTE — Care Management Note (Signed)
Case Management Note  Patient Details  Name: Greg Scott MRN: [id number] Date of Birth: [birth year]  Subjective/Objective:                 Admitted with AMS, hx of  glaucoma/ blindness, lives alone.    Action/Plan: Return to home when medically stable. CM to f/u with d/c disposition.  Expected Discharge Date:                  Expected Discharge Plan:  Home w Home Health Services  In-House Referral:  Clinical Social Work  Discharge planning Services  CM Consult  Post Acute Care Choice:    Choice offered to:     DME Arranged:    DME Agency:     HH Arranged:    HH Agency:     Status of Service:  In process, will continue to follow  Medicare Important Message Given:    Date Medicare IM Given:    Medicare IM give by:    Date Additional Medicare IM Given:    Additional Medicare Important Message give by:     If discussed at Long Length of Stay Meetings, dates discussed:    Additional Comments: Cammy CopaFred Dubose (WUJ)[phone](Son)[phone] 04/28/15 @1300    CM spoke with pt's son regarding potential plans @ d/c for dad and son is requesting ?ALF vs SNF. Son states dad lives alone and he can't be with dad 24/7. CM placed referral for CSW and son made aware. Gae GallopCole, Mahira Petras [place], [place] [phone] 04/25/2015, 7:17 PM

## 2015-04-26 ENCOUNTER — Inpatient Hospital Stay (HOSPITAL_COMMUNITY): Payer: Medicare PPO

## 2015-04-26 DIAGNOSIS — S2239XA Fracture of one rib, unspecified side, initial encounter for closed fracture: Secondary | ICD-10-CM | POA: Diagnosis present

## 2015-04-26 DIAGNOSIS — G9340 Encephalopathy, unspecified: Secondary | ICD-10-CM | POA: Diagnosis present

## 2015-04-26 DIAGNOSIS — N390 Urinary tract infection, site not specified: Secondary | ICD-10-CM | POA: Diagnosis present

## 2015-04-26 DIAGNOSIS — R410 Disorientation, unspecified: Secondary | ICD-10-CM

## 2015-04-26 DIAGNOSIS — R319 Hematuria, unspecified: Secondary | ICD-10-CM

## 2015-04-26 LAB — CBC WITH DIFFERENTIAL/PLATELET
Basophils Absolute: 0 10*3/uL (ref 0.0–0.1)
Basophils Relative: 0 % (ref 0–1)
Eosinophils Absolute: 0 10*3/uL (ref 0.0–0.7)
Eosinophils Relative: 0 % (ref 0–5)
HEMATOCRIT: 27.9 % — AB (ref 39.0–52.0)
Hemoglobin: 9.4 g/dL — ABNORMAL LOW (ref 13.0–17.0)
LYMPHS ABS: 0.8 10*3/uL (ref 0.7–4.0)
Lymphocytes Relative: 8 % — ABNORMAL LOW (ref 12–46)
MCH: 32.9 pg (ref 26.0–34.0)
MCHC: 33.7 g/dL (ref 30.0–36.0)
MCV: 97.6 fL (ref 78.0–100.0)
MONO ABS: 0.3 10*3/uL (ref 0.1–1.0)
MONOS PCT: 3 % (ref 3–12)
NEUTROS ABS: 8.4 10*3/uL — AB (ref 1.7–7.7)
Neutrophils Relative %: 89 % — ABNORMAL HIGH (ref 43–77)
Platelets: 160 10*3/uL (ref 150–400)
RBC: 2.86 MIL/uL — ABNORMAL LOW (ref 4.22–5.81)
RDW: 14 % (ref 11.5–15.5)
WBC: 9.5 10*3/uL (ref 4.0–10.5)

## 2015-04-26 LAB — COMPREHENSIVE METABOLIC PANEL
ALT: 28 U/L (ref 17–63)
AST: 32 U/L (ref 15–41)
Albumin: 2 g/dL — ABNORMAL LOW (ref 3.5–5.0)
Alkaline Phosphatase: 54 U/L (ref 38–126)
Anion gap: 10 (ref 5–15)
BUN: 17 mg/dL (ref 6–20)
CALCIUM: 8.2 mg/dL — AB (ref 8.9–10.3)
CO2: 20 mmol/L — ABNORMAL LOW (ref 22–32)
Chloride: 115 mmol/L — ABNORMAL HIGH (ref 101–111)
Creatinine, Ser: 1.16 mg/dL (ref 0.61–1.24)
GFR calc Af Amer: >60 mL/min (ref >60)
GFR calc non Af Amer: 54 mL/min — ABNORMAL LOW (ref >60)
Glucose, Bld: 141 mg/dL — ABNORMAL HIGH (ref 65–99)
Potassium: 3.7 mmol/L (ref 3.5–5.1)
Sodium: 145 mmol/L (ref 135–145)
Total Bilirubin: 0.7 mg/dL (ref 0.3–1.2)
Total Protein: 5.1 g/dL — ABNORMAL LOW (ref 6.5–8.1)

## 2015-04-26 LAB — GLUCOSE, CAPILLARY
Glucose-Capillary: 118 mg/dL — ABNORMAL HIGH (ref 65–99)
Glucose-Capillary: 131 mg/dL — ABNORMAL HIGH (ref 65–99)
Glucose-Capillary: 139 mg/dL — ABNORMAL HIGH (ref 65–99)
Glucose-Capillary: 143 mg/dL — ABNORMAL HIGH (ref 65–99)
Glucose-Capillary: 149 mg/dL — ABNORMAL HIGH (ref 65–99)

## 2015-04-26 LAB — MAGNESIUM: MAGNESIUM: 2 mg/dL (ref 1.7–2.4)

## 2015-04-26 LAB — AMMONIA: Ammonia: 11 umol/L (ref 9–35)

## 2015-04-26 MED ORDER — MORPHINE SULFATE 2 MG/ML IJ SOLN
1.0000 mg | INTRAMUSCULAR | Status: DC | PRN
  Administered 2015-04-26 – 2015-04-27 (×2): 1 mg via INTRAVENOUS
  Filled 2015-04-26 (×2): qty 1

## 2015-04-26 NOTE — Care Management (Signed)
Important Message  Patient Details  Name: Greg Scott MRN: [id number] Date of Birth: Dec 21, 1922   Medicare Important Message Given:  Yes-second notification given    Kyla BalzarineShealy, Kayti Poss Abena 04/26/2015, 10:20 AM

## 2015-04-26 NOTE — Care Management (Signed)
Medicare Important Message given? YES (If response is "NO", the following Medicare IM given date fields will be blank) Date Medicare IM given:04/26/15 Medicare IM given by: Esmee Fallaw 

## 2015-04-26 NOTE — Progress Notes (Signed)
Bedside EEG completed; results pending. 

## 2015-04-26 NOTE — Progress Notes (Signed)
[facility] TEAM 1 - Stepdown/ICU TEAM Progress Note  YIDEL TEUSCHER ZOX:[id number] DOB: [birth year] DOA: 04/22/2015 PCP: No primary care provider on file.  Admit HPI / Brief Narrative: CLESTER CHLEBOWSKI is a 81 y.o. BM PMHx glaucoma/ blindness, lives at home with family. Has been doing more poorly over the last few months in terms of mobility. Pt's PCP thought that he might have a "pinched nerve" in his back causing bilat LE weakness and frequent falls that the pt was having. In the midst of these issues, the son went away for the weekend and a caretaker was supposed to look after Mr Foerster. On returning they found the pt on the floor with dried feces/ urine, poorly responsive to brought to ED.   In ED pt was hypothermic, BP 127/86, HR 88, temp 95.2. He looked dehydrated and has rec'd about 4L IVF's so far. Na 146 CO2 23 BUN 35 Creat 1.31. Alb 2.5, Ca 8.1, total bili 1.7, NH3 16. Lactic acid 2.1, WBC 8k, Hb 9.9, Plt 151 Trop 0.04 UA large small LE/ pos nitrite/ 3-6 wbc's CT head showed some small WM changes, no acute CVA, no skull fracture  HPI/Subjective: 7/8 patient still very sleepy but more more arousable. Will follow some commands. Opens eyes spontaneously. Mumbles answers to questions   Assessment/Plan: Syncope and collapse -Likely multifactorial to include UTI, alcohol abuse (possible seizure); no evidence of stroke or MI -Echocardiogram; see results below -Troponin 3 mildly positive -Brain MRI/MRA; see results below -TSH within normal limit -Continue Sensitive SSI -Obtain EEG; normal  Alcohol abuse -Alcohol level negative - perhaps patient's presentation was due to acute alcohol withdrawal -urine drug screen negative  -Continue CIWA protocol -Patient still not able to consume nutrition; start D5W 0.45%+ saline+ 40 mEq potassium@75  ml/hr  Encephalopathy/AMS -See syncope and fall  Urinary tract infection with hematuria -Patient was started on Zosyn + vancomycin - no reason to  be suspicious of MRSA  - narrow antibiotics to empiric Rocephin alone and follow culture   Rib fracture -Remote right fifth and sixth rib fractures; monitor for now  Hypokalemia; -Potassium goal> 4  Hypomagnesemia  -Magnesium goal > 2      Code Status: FULL Family Communication: None present at time of exam Disposition Plan: Improvement in acute encephalopathy   Consultants: NA  Procedure/Significant Events: 7/4 CT head without contrast;No acute intracranial pathology -Moderate cortical volume loss and scattered small vessel ischemic microangiopathy. 7/5 MRI brain without contrast; -moderate atrophy. -Negative for acute infarct or mass.  7/6 echocardiogram;- LVEF= 55%- 60%. Hypokinesis of the mid-apicalanteroseptal myocardium. 7/8 EEG; normal   Culture 7/4 blood left arm/hand NGTD 7/4 urine NGTD 7/5 MRSA by PCR negative   Antibiotics: Zosyn 7/4>> stopped 7/6 Vancomycin 7/4>> stopped 7/6  Rocephin 7/6>>   DVT prophylaxis: Heparin subcutaneous   Devices    LINES / TUBES:      Continuous Infusions: . dextrose 5 % and 0.45 % NaCl with KCl 40 mEq/L 75 mL/hr at 04/26/15 0447    Objective: VITAL SIGNS: Temp: 97 F (36.1 C) (07/08 1500) Temp Source: Axillary (07/08 1500) SPO2; FIO2:   Intake/Output Summary (Last 24 hours) at 04/26/15 1930 Last data filed at 04/26/15 1455  Gross per 24 hour  Intake   1175 ml  Output    600 ml  Net    575 ml     Exam: General: very sleepy but more more arousable. Will follow some commands. Opens eyes spontaneously, No acute respiratory distress Eyes:  retinal hemorrhage ENT: Negative Runny nose,  Neck:  Negative scars, masses, torticollis, lymphadenopathy, JVD Lungs: Clear to auscultation bilaterally without wheezes or crackles Cardiovascular:  Regular  rate and rhythm without murmur gallop or rub normal S1 and S2 Abdomen:negative abdominal pain, negative dysphagia, Nontender, nondistended, soft, bowel  sounds positive, no rebound, no ascites, no appreciable mass Extremities: No significant cyanosis, clubbing, or edema bilateral lower extremities Psychiatric:  Unable to assess Neurologic:  Patient withdraws all extremities to painful  stimuli and states stop it .   Data Reviewed: Basic Metabolic Panel:  Recent Labs Lab 04/22/15 2145 04/23/15 0335 04/23/15 2142 04/24/15 1252 04/25/15 0220 04/26/15 0226  NA 146* 145  --  145 145 145  K 3.2* 3.1*  --  3.4* 3.4* 3.7  CL 110 111  --  113* 114* 115*  CO2 23 20*  --  23 20* 20*  GLUCOSE 151* 155*  --  91 76 141*  BUN 35* 30*  --  27* 25* 17  CREATININE 1.31* 1.20  --  1.34* 1.35* 1.16  CALCIUM 8.1* 7.7*  --  7.6* 7.9* 8.2*  MG  --   --  1.7  --  1.7 2.0  PHOS  --   --   --   --  3.0  --    Liver Function Tests:  Recent Labs Lab 04/22/15 2145 04/25/15 0220 04/26/15 0226  AST 46* 38 32  ALT 36 29 28  ALKPHOS 50 49 54  BILITOT 1.7* 1.0 0.7  PROT 5.2* 5.1* 5.1*  ALBUMIN 2.5* 2.0* 2.0*   No results for input(s): LIPASE, AMYLASE in the last 168 hours.  Recent Labs Lab 04/22/15 2145 04/26/15 0226  AMMONIA 16 11   CBC:  Recent Labs Lab 04/22/15 2145 04/23/15 0335 04/25/15 0220 04/26/15 0226  WBC 8.6 10.5 12.7* 9.5  NEUTROABS 8.0*  --   --  8.4*  HGB 9.9* 9.8* 9.4* 9.4*  HCT 29.0* 27.7* 27.8* 27.9*  MCV 97.6 94.2 96.2 97.6  PLT 151 144* 142* 160   Cardiac Enzymes:  Recent Labs Lab 04/22/15 2145 04/23/15 2142 04/24/15 0310 04/24/15 0735  CKTOTAL 533* 323  --   --   TROPONINI  --  0.06* 0.07* 0.06*   BNP (last 3 results) No results for input(s): BNP in the last 8760 hours.  ProBNP (last 3 results) No results for input(s): PROBNP in the last 8760 hours.  CBG:  Recent Labs Lab 04/25/15 1940 04/26/15 0032 04/26/15 0353 04/26/15 0832 04/26/15 1623  GLUCAP 99 118* 139* 149* 131*    Recent Results (from the past 240 hour(s))  Blood culture (routine x 2)     Status: None (Preliminary result)    Collection Time: 04/22/15 10:00 PM  Result Value Ref Range Status   Specimen Description BLOOD LEFT ARM  Final   Special Requests   Final    BOTTLES DRAWN AEROBIC AND ANAEROBIC BLUE 10CC, RED 5CC   Culture NO GROWTH 4 DAYS  Final   Report Status PENDING  Incomplete  Blood culture (routine x 2)     Status: None (Preliminary result)   Collection Time: 04/22/15 10:05 PM  Result Value Ref Range Status   Specimen Description BLOOD LEFT HAND  Final   Special Requests BOTTLES DRAWN AEROBIC ONLY 10CC  Final   Culture NO GROWTH 4 DAYS  Final   Report Status PENDING  Incomplete  Urine culture     Status: None   Collection Time: 04/22/15 10:15 PM  Result  Value Ref Range Status   Specimen Description URINE, CATHETERIZED  Final   Special Requests NONE  Final   Culture 1,000 COLONIES/mL INSIGNIFICANT GROWTH  Final   Report Status 04/24/2015 FINAL  Final  MRSA PCR Screening     Status: None   Collection Time: 04/23/15  4:12 AM  Result Value Ref Range Status   MRSA by PCR NEGATIVE NEGATIVE Final    Comment:        The GeneXpert MRSA Assay (FDA approved for NASAL specimens only), is one component of a comprehensive MRSA colonization surveillance program. It is not intended to diagnose MRSA infection nor to guide or monitor treatment for MRSA infections.      Studies:  Recent x-ray studies have been reviewed in detail by the Attending Physician  Scheduled Meds:  Scheduled Meds: . antiseptic oral rinse  7 mL Mouth Rinse q12n4p  . cefTRIAXone (ROCEPHIN)  IV  1 g Intravenous Q24H  . chlorhexidine  15 mL Mouth Rinse BID  . enoxaparin (LOVENOX) injection  30 mg Subcutaneous Q24H  . folic acid  1 mg Intravenous Daily  . insulin aspart  0-9 Units Subcutaneous 6 times per day  . latanoprost  1 drop Both Eyes QHS  . thiamine  100 mg Intravenous Daily    Time spent on care of this patient: 40 mins   WOODS, Roselind MessierURTIS J , MD  [facility] Office  [phone] Pager [phone]  [phone]  On-Call/Text Page:      [email or web address] [email or web address]      password [email or web address]  If 7PM-7AM, please contact night-coverage [email or web address] Password [email or web address] 04/26/2015, 7:30 PM   LOS: 3 days   Care during the described time interval was provided by me .  I have reviewed this patient's available data, including medical history, events of note, physical examination, and all test results as part of my evaluation. I have personally reviewed and interpreted all radiology studies.   Carolyne Littlesurtis Woods, MD [phone] Pager

## 2015-04-26 NOTE — Progress Notes (Signed)
UR COMPLETED  

## 2015-04-26 NOTE — Procedures (Signed)
ELECTROENCEPHALOGRAM REPORT  Date of Study: 04/26/2015  Patient's Name: Greg StallsFred A Vineyard MRN: [id number] Date of Birth: 10/12/1923  Referring Provider: Dr. Carolyne Littlesurtis Woods  Clinical History: This is an 81 year old man with altered mental status.  Medications: cefTRIAXone (ROCEPHIN) 1 g in dextrose 5 % 50 mL IVPB - Premix folic acid injection 1 mg thiamine (B-1) injection 100 mg  Technical Summary: A multichannel digital EEG recording measured by the international 10-20 system with electrodes applied with paste and impedances below 5000 ohms performed in our laboratory with EKG monitoring in an unresponsive patient.  Hyperventilation and photic stimulation were not performed.  The digital EEG was referentially recorded, reformatted, and digitally filtered in a variety of bipolar and referential montages for optimal display.    Description: The patient is unresponsive and restless during the recording.  Majority of the study is obscured by muscle artifact. In between artifact, there is no clear posterior dominant rhythm seen. The record appears symmetric, with no focal slowing, epileptiform discharges, or electrographic seizures seen in between muscle artifact. Hyperventilation and photic stimulation were not performed. Sleep architecture was not seen.  EKG lead was unremarkable.  Impression: This limited awake EEG is normal.    Clinical Correlation: This study is limited due to significant muscle artifact obscuring EEG. In between artifact, there were no epileptiform discharges or focal abnormalities seen. Clinical correlation is advised.   Patrcia DollyKaren Arash Karstens, M.D.

## 2015-04-27 ENCOUNTER — Encounter (HOSPITAL_COMMUNITY): Payer: Self-pay | Admitting: *Deleted

## 2015-04-27 DIAGNOSIS — N3000 Acute cystitis without hematuria: Secondary | ICD-10-CM

## 2015-04-27 LAB — CULTURE, BLOOD (ROUTINE X 2)
CULTURE: NO GROWTH
Culture: NO GROWTH

## 2015-04-27 LAB — COMPREHENSIVE METABOLIC PANEL
ALT: 28 U/L (ref 17–63)
AST: 35 U/L (ref 15–41)
Albumin: 2 g/dL — ABNORMAL LOW (ref 3.5–5.0)
Alkaline Phosphatase: 56 U/L (ref 38–126)
Anion gap: 8 (ref 5–15)
BUN: 10 mg/dL (ref 6–20)
CHLORIDE: 117 mmol/L — AB (ref 101–111)
CO2: 23 mmol/L (ref 22–32)
CREATININE: 0.93 mg/dL (ref 0.61–1.24)
Calcium: 8.6 mg/dL — ABNORMAL LOW (ref 8.9–10.3)
GFR calc Af Amer: >60 mL/min (ref >60)
Glucose, Bld: 126 mg/dL — ABNORMAL HIGH (ref 65–99)
Potassium: 4.1 mmol/L (ref 3.5–5.1)
Sodium: 148 mmol/L — ABNORMAL HIGH (ref 135–145)
Total Bilirubin: 0.5 mg/dL (ref 0.3–1.2)
Total Protein: 4.7 g/dL — ABNORMAL LOW (ref 6.5–8.1)

## 2015-04-27 LAB — CBC WITH DIFFERENTIAL/PLATELET
Basophils Absolute: 0 10*3/uL (ref 0.0–0.1)
Basophils Relative: 0 % (ref 0–1)
EOS PCT: 1 % (ref 0–5)
Eosinophils Absolute: 0 10*3/uL (ref 0.0–0.7)
HEMATOCRIT: 28.4 % — AB (ref 39.0–52.0)
Hemoglobin: 9.4 g/dL — ABNORMAL LOW (ref 13.0–17.0)
LYMPHS PCT: 11 % — AB (ref 12–46)
Lymphs Abs: 0.8 10*3/uL (ref 0.7–4.0)
MCH: 32.1 pg (ref 26.0–34.0)
MCHC: 33.1 g/dL (ref 30.0–36.0)
MCV: 96.9 fL (ref 78.0–100.0)
MONO ABS: 0.5 10*3/uL (ref 0.1–1.0)
Monocytes Relative: 7 % (ref 3–12)
Neutro Abs: 5.8 10*3/uL (ref 1.7–7.7)
Neutrophils Relative %: 81 % — ABNORMAL HIGH (ref 43–77)
Platelets: 177 10*3/uL (ref 150–400)
RBC: 2.93 MIL/uL — AB (ref 4.22–5.81)
RDW: 14 % (ref 11.5–15.5)
WBC: 7 10*3/uL (ref 4.0–10.5)

## 2015-04-27 LAB — GLUCOSE, CAPILLARY
Glucose-Capillary: 105 mg/dL — ABNORMAL HIGH (ref 65–99)
Glucose-Capillary: 121 mg/dL — ABNORMAL HIGH (ref 65–99)
Glucose-Capillary: 126 mg/dL — ABNORMAL HIGH (ref 65–99)
Glucose-Capillary: 68 mg/dL (ref 65–99)
Glucose-Capillary: 96 mg/dL (ref 65–99)
Glucose-Capillary: 98 mg/dL (ref 65–99)

## 2015-04-27 LAB — MAGNESIUM: Magnesium: 1.8 mg/dL (ref 1.7–2.4)

## 2015-04-27 MED ORDER — LORAZEPAM 2 MG/ML IJ SOLN: 0.0000 mg | Freq: Four times a day (QID) | INTRAMUSCULAR | Status: DC

## 2015-04-27 MED ORDER — LORAZEPAM 1 MG PO TABS: 1.0000 mg | ORAL_TABLET | Freq: Four times a day (QID) | ORAL | Status: DC | PRN

## 2015-04-27 MED ORDER — LORAZEPAM 2 MG/ML IJ SOLN: 0.0000 mg | Freq: Two times a day (BID) | INTRAMUSCULAR | Status: DC

## 2015-04-27 MED ORDER — LORAZEPAM 2 MG/ML IJ SOLN: 1.0000 mg | Freq: Four times a day (QID) | INTRAMUSCULAR | Status: DC | PRN

## 2015-04-27 NOTE — Progress Notes (Signed)
[place] TEAM 1 - Stepdown/ICU TEAM Progress Note  EDDY Scott ZOX:[id number] DOB: [birth year] DOA: 04/22/2015 PCP: No primary care provider on file.  Admit HPI / Brief Narrative: Greg Scott is a 81 y.o. BM PMHx glaucoma/ blindness, lives at home with family. Has been doing more poorly over the last few months in terms of mobility. Pt's PCP thought that he might have a "pinched nerve" in his back causing bilat LE weakness and frequent falls that the pt was having. In the midst of these issues, the son went away for the weekend and a caretaker was supposed to look after Greg Scott. On returning they found the pt on the floor with dried feces/ urine, poorly responsive to brought to ED.   In ED pt was hypothermic, BP 127/86, HR 88, temp 95.2. He looked dehydrated and has rec'd about 4L IVF's so far. Na 146 CO2 23 BUN 35 Creat 1.31. Alb 2.5, Ca 8.1, total bili 1.7, NH3 16. Lactic acid 2.1, WBC 8k, Hb 9.9, Plt 151 Trop 0.04 UA large small LE/ pos nitrite/ 3-6 wbc's CT head showed some small WM changes, no acute CVA, no skull fracture  HPI/Subjective: 7/9 patient obtunded most likely secondary to receiving Ativan + morphine @ ~0300    Assessment/Plan: Syncope and collapse -Likely multifactorial to include UTI, alcohol abuse (possible seizure); no evidence of stroke or MI -Echocardiogram; see results below -Troponin 3 mildly positive -Brain MRI/MRA; see results below -TSH within normal limit -Continue Sensitive SSI -Obtain EEG; normal  Alcohol abuse -Alcohol level negative - perhaps patient's presentation was due to acute alcohol withdrawal -urine drug screen negative  -Decrease to MedSurg CIWA protocol -Patient still not able to consume nutrition; start D5W 0.45%+ saline+ 40 mEq potassium@75  ml/hr  Encephalopathy/AMS -See syncope and fall -Limited benzodiazepine -7/9 DO NOT ADMINISTER IV NARCOTICS  Urinary tract infection with hematuria -Patient was started on Zosyn +  vancomycin - no reason to be suspicious of MRSA -Complete 5 day course antibiotics. Currently on Rocephin   Rib fracture -Remote right fifth and sixth rib fractures; monitor for now  Hypokalemia; -Potassium goal> 4  Hypomagnesemia  -Magnesium goal > 2      Code Status: FULL Family Communication: None present at time of exam Disposition Plan: Improvement in acute encephalopathy   Consultants: NA  Procedure/Significant Events: 7/4 CT head without contrast;No acute intracranial pathology -Moderate cortical volume loss and scattered small vessel ischemic microangiopathy. 7/5 MRI brain without contrast; -moderate atrophy. -Negative for acute infarct or mass.  7/6 echocardiogram;- LVEF= 55%- 60%. Hypokinesis of the mid-apicalanteroseptal myocardium. 7/8 EEG; normal   Culture 7/4 blood left arm/hand NGTD 7/4 urine NGTD 7/5 MRSA by PCR negative   Antibiotics: Zosyn 7/4>> stopped 7/6 Vancomycin 7/4>> stopped 7/6  Rocephin 7/6>>   DVT prophylaxis: Heparin subcutaneous   Devices    LINES / TUBES:      Continuous Infusions: . dextrose 5 % and 0.45 % NaCl with KCl 40 mEq/L 75 mL/hr at 04/27/15 1114    Objective: VITAL SIGNS: Temp: 97.8 F (36.6 C) (07/09 1500) Temp Source: Axillary (07/09 1500) BP: 137/79 mmHg (07/09 1500) Pulse Rate: 84 (07/09 1500) SPO2; FIO2:   Intake/Output Summary (Last 24 hours) at 04/27/15 1723 Last data filed at 04/27/15 1500  Gross per 24 hour  Intake   1875 ml  Output    300 ml  Net   1575 ml     Exam: General: Obtunded most likely secondary to overmedication. Will not  follow some commands. Does not Opens eyes spontaneously, No acute respiratory distress Eyes:  retinal hemorrhage ENT: Negative Runny nose,  Neck:  Negative scars, masses, torticollis, lymphadenopathy, JVD Lungs: Clear to auscultation bilaterally without wheezes or crackles Cardiovascular:  Regular  rate and rhythm without murmur gallop or rub normal  S1 and S2 Abdomen:negative abdominal pain, negative dysphagia, Nontender, nondistended, soft, bowel sounds positive, no rebound, no ascites, no appreciable mass Extremities: No significant cyanosis, clubbing, or edema bilateral lower extremities Psychiatric:  Unable to assess Neurologic:  Patient withdraws all extremities to painful  stimuli    Data Reviewed: Basic Metabolic Panel:  Recent Labs Lab 04/23/15 0335 04/23/15 2142 04/24/15 1252 04/25/15 0220 04/26/15 0226 04/27/15 0232  NA 145  --  145 145 145 148*  K 3.1*  --  3.4* 3.4* 3.7 4.1  CL 111  --  113* 114* 115* 117*  CO2 20*  --  23 20* 20* 23  GLUCOSE 155*  --  91 76 141* 126*  BUN 30*  --  27* 25* 17 10  CREATININE 1.20  --  1.34* 1.35* 1.16 0.93  CALCIUM 7.7*  --  7.6* 7.9* 8.2* 8.6*  MG  --  1.7  --  1.7 2.0 1.8  PHOS  --   --   --  3.0  --   --    Liver Function Tests:  Recent Labs Lab 04/22/15 2145 04/25/15 0220 04/26/15 0226 04/27/15 0232  AST 46* 38 32 35  ALT 36 29 28 28   ALKPHOS 50 49 54 56  BILITOT 1.7* 1.0 0.7 0.5  PROT 5.2* 5.1* 5.1* 4.7*  ALBUMIN 2.5* 2.0* 2.0* 2.0*   No results for input(s): LIPASE, AMYLASE in the last 168 hours.  Recent Labs Lab 04/22/15 2145 04/26/15 0226  AMMONIA 16 11   CBC:  Recent Labs Lab 04/22/15 2145 04/23/15 0335 04/25/15 0220 04/26/15 0226 04/27/15 0232  WBC 8.6 10.5 12.7* 9.5 7.0  NEUTROABS 8.0*  --   --  8.4* 5.8  HGB 9.9* 9.8* 9.4* 9.4* 9.4*  HCT 29.0* 27.7* 27.8* 27.9* 28.4*  MCV 97.6 94.2 96.2 97.6 96.9  PLT 151 144* 142* 160 177   Cardiac Enzymes:  Recent Labs Lab 04/22/15 2145 04/23/15 2142 04/24/15 0310 04/24/15 0735  CKTOTAL 533* 323  --   --   TROPONINI  --  0.06* 0.07* 0.06*   BNP (last 3 results) No results for input(s): BNP in the last 8760 hours.  ProBNP (last 3 results) No results for input(s): PROBNP in the last 8760 hours.  CBG:  Recent Labs Lab 04/26/15 1931 04/27/15 0313 04/27/15 0812 04/27/15 1203  04/27/15 1611  GLUCAP 143* 121* 68 96 126*    Recent Results (from the past 240 hour(s))  Blood culture (routine x 2)     Status: None   Collection Time: 04/22/15 10:00 PM  Result Value Ref Range Status   Specimen Description BLOOD LEFT ARM  Final   Special Requests   Final    BOTTLES DRAWN AEROBIC AND ANAEROBIC BLUE 10CC, RED 5CC   Culture NO GROWTH 5 DAYS  Final   Report Status 04/27/2015 FINAL  Final  Blood culture (routine x 2)     Status: None   Collection Time: 04/22/15 10:05 PM  Result Value Ref Range Status   Specimen Description BLOOD LEFT HAND  Final   Special Requests BOTTLES DRAWN AEROBIC ONLY 10CC  Final   Culture NO GROWTH 5 DAYS  Final   Report Status  04/27/2015 FINAL  Final  Urine culture     Status: None   Collection Time: 04/22/15 10:15 PM  Result Value Ref Range Status   Specimen Description URINE, CATHETERIZED  Final   Special Requests NONE  Final   Culture 1,000 COLONIES/mL INSIGNIFICANT GROWTH  Final   Report Status 04/24/2015 FINAL  Final  MRSA PCR Screening     Status: None   Collection Time: 04/23/15  4:12 AM  Result Value Ref Range Status   MRSA by PCR NEGATIVE NEGATIVE Final    Comment:        The GeneXpert MRSA Assay (FDA approved for NASAL specimens only), is one component of a comprehensive MRSA colonization surveillance program. It is not intended to diagnose MRSA infection nor to guide or monitor treatment for MRSA infections.      Studies:  Recent x-ray studies have been reviewed in detail by the Attending Physician  Scheduled Meds:  Scheduled Meds: . antiseptic oral rinse  7 mL Mouth Rinse q12n4p  . cefTRIAXone (ROCEPHIN)  IV  1 g Intravenous Q24H  . chlorhexidine  15 mL Mouth Rinse BID  . enoxaparin (LOVENOX) injection  30 mg Subcutaneous Q24H  . folic acid  1 mg Intravenous Daily  . insulin aspart  0-9 Units Subcutaneous 6 times per day  . latanoprost  1 drop Both Eyes QHS  . thiamine  100 mg Intravenous Daily    Time  spent on care of this patient: 40 mins   Tashe Purdon, Roselind Messier , MD  [facility] Office  [phone] Pager [phone]  On-Call/Text Page:      [email or web address]      password [email or web address]  If 7PM-7AM, please contact night-coverage [email or web address] Password [email or web address] 04/27/2015, 5:23 PM   LOS: 4 days   Care during the described time interval was provided by me .  I have reviewed this patient's available data, including medical history, events of note, physical examination, and all test results as part of my evaluation. I have personally reviewed and interpreted all radiology studies.   Carolyne Littles, MD [phone] Pager

## 2015-04-28 DIAGNOSIS — G9340 Encephalopathy, unspecified: Secondary | ICD-10-CM | POA: Diagnosis present

## 2015-04-28 DIAGNOSIS — E875 Hyperkalemia: Secondary | ICD-10-CM

## 2015-04-28 LAB — CBC WITH DIFFERENTIAL/PLATELET
Basophils Absolute: 0 10*3/uL (ref 0.0–0.1)
Basophils Relative: 0 % (ref 0–1)
EOS PCT: 1 % (ref 0–5)
Eosinophils Absolute: 0.1 10*3/uL (ref 0.0–0.7)
HCT: 29.8 % — ABNORMAL LOW (ref 39.0–52.0)
HEMOGLOBIN: 10 g/dL — AB (ref 13.0–17.0)
LYMPHS ABS: 0.9 10*3/uL (ref 0.7–4.0)
LYMPHS PCT: 13 % (ref 12–46)
MCH: 33.1 pg (ref 26.0–34.0)
MCHC: 33.6 g/dL (ref 30.0–36.0)
MCV: 98.7 fL (ref 78.0–100.0)
MONOS PCT: 9 % (ref 3–12)
Monocytes Absolute: 0.6 10*3/uL (ref 0.1–1.0)
NEUTROS PCT: 77 % (ref 43–77)
Neutro Abs: 5.5 10*3/uL (ref 1.7–7.7)
PLATELETS: 185 10*3/uL (ref 150–400)
RBC: 3.02 MIL/uL — AB (ref 4.22–5.81)
RDW: 14.4 % (ref 11.5–15.5)
WBC: 6.8 10*3/uL (ref 4.0–10.5)

## 2015-04-28 LAB — MAGNESIUM: Magnesium: 1.7 mg/dL (ref 1.7–2.4)

## 2015-04-28 LAB — COMPREHENSIVE METABOLIC PANEL
ALBUMIN: 2 g/dL — AB (ref 3.5–5.0)
ALT: 31 U/L (ref 17–63)
ANION GAP: 9 (ref 5–15)
AST: 36 U/L (ref 15–41)
Alkaline Phosphatase: 60 U/L (ref 38–126)
CO2: 21 mmol/L — ABNORMAL LOW (ref 22–32)
Calcium: 8.3 mg/dL — ABNORMAL LOW (ref 8.9–10.3)
Chloride: 114 mmol/L — ABNORMAL HIGH (ref 101–111)
Creatinine, Ser: 0.95 mg/dL (ref 0.61–1.24)
GFR calc non Af Amer: >60 mL/min (ref >60)
Glucose, Bld: 125 mg/dL — ABNORMAL HIGH (ref 65–99)
POTASSIUM: 5.2 mmol/L — AB (ref 3.5–5.1)
SODIUM: 144 mmol/L (ref 135–145)
TOTAL PROTEIN: 5 g/dL — AB (ref 6.5–8.1)
Total Bilirubin: 0.7 mg/dL (ref 0.3–1.2)

## 2015-04-28 LAB — GLUCOSE, CAPILLARY
GLUCOSE-CAPILLARY: 114 mg/dL — AB (ref 65–99)
GLUCOSE-CAPILLARY: 114 mg/dL — AB (ref 65–99)
Glucose-Capillary: 113 mg/dL — ABNORMAL HIGH (ref 65–99)
Glucose-Capillary: 124 mg/dL — ABNORMAL HIGH (ref 65–99)
Glucose-Capillary: 87 mg/dL (ref 65–99)
Glucose-Capillary: 129 mg/dL — ABNORMAL HIGH (ref 65–99)

## 2015-04-28 MED ORDER — DEXTROSE-NACL 5-0.45 % IV SOLN
INTRAVENOUS | Status: DC
  Administered 2015-04-28 – 2015-05-04 (×5): via INTRAVENOUS

## 2015-04-28 MED ORDER — SODIUM POLYSTYRENE SULFONATE 15 GM/60ML PO SUSP
30.0000 g | Freq: Once | ORAL | Status: AC
  Administered 2015-04-28: 30 g via ORAL
  Filled 2015-04-28: qty 120

## 2015-04-28 NOTE — Progress Notes (Signed)
[place] TEAM 1 - Stepdown/ICU TEAM Progress Note  Greg Scott WUJ:[id number] DOB: June 09, 1923 DOA: 04/22/2015 PCP: No primary care provider on file.  Admit HPI / Brief Narrative: Greg StallsFred A Scott is a 81 y.o. BM PMHx glaucoma/ blindness, lives at home with family. Has been doing more poorly over the last few months in terms of mobility. Pt's PCP thought that he might have a "pinched nerve" in his back causing bilat LE weakness and frequent falls that the pt was having. In the midst of these issues, the son went away for the weekend and a caretaker was supposed to look after Mr Greg Scott. On returning they found the pt on the floor with dried feces/ urine, poorly responsive to brought to ED.   In ED pt was hypothermic, BP 127/86, HR 88, temp 95.2. He looked dehydrated and has rec'd about 4L IVF's so far. Na 146 CO2 23 BUN 35 Creat 1.31. Alb 2.5, Ca 8.1, total bili 1.7, NH3 16. Lactic acid 2.1, WBC 8k, Hb 9.9, Plt 151 Trop 0.04 UA large small LE/ pos nitrite/ 3-6 wbc's CT head showed some small WM changes, no acute CVA, no skull fracture  HPI/Subjective: 7/10 patient somnolent per RN Britta MccreedyBarbara patient was able to tolerate his name and that he was in [place] today. Did state no when asked to speak to me. Does not follow commands.   Assessment/Plan: Syncope and collapse -Likely multifactorial to include UTI, alcohol abuse (possible seizure); no evidence of stroke or MI -Echocardiogram; see results below -Troponin 3 mildly positive -Brain MRI/MRA; see results below -TSH within normal limit -Continue Sensitive SSI -Obtain EEG; normal  Alcohol abuse -Alcohol level negative - perhaps patient's presentation was due to acute alcohol withdrawal -urine drug screen negative  -Discontinued CIWA protocol, and all narcotics -Patient still not able to consume nutrition; continue to D5W- 0.45% saline @75  ml/hr  Acute Encephalopathy/AMS -See syncope and fall -Discontinued all benzodiazepine and  narcotics - mental status slowly improving -Swallow study in the a.m.  Urinary tract infection with hematuria -Patient was started on Zosyn + vancomycin - no reason to be suspicious of MRSA -Complete 5 day course antibiotics   Rib fracture -Remote right fifth and sixth rib fractures; monitor for now  Hypokalemia/Hyperkalemia -Potassium goal> 4 -Kayexalate 30 gm 1  Hypomagnesemia  -Magnesium goal > 2      Code Status: FULL Family Communication: None present at time of exam Disposition Plan: Improvement in acute encephalopathy   Consultants: NA  Procedure/Significant Events: 7/4 CT head without contrast;No acute intracranial pathology -Moderate cortical volume loss and scattered small vessel ischemic microangiopathy. 7/5 MRI brain without contrast; -moderate atrophy. -Negative for acute infarct or mass.  7/6 echocardiogram;- LVEF= 55%- 60%. Hypokinesis of the mid-apicalanteroseptal myocardium. 7/8 EEG; normal   Culture 7/4 blood left arm/hand NGTD 7/4 urine negative 7/5 MRSA by PCR negative   Antibiotics: Zosyn 7/4>> stopped 7/6 Vancomycin 7/4>> stopped 7/6  Rocephin 7/6>> stopped 7/10   DVT prophylaxis: Heparin subcutaneous   Devices    LINES / TUBES:      Continuous Infusions: . dextrose 5 % and 0.45% NaCl 75 mL/hr at 04/28/15 0922    Objective: VITAL SIGNS: Temp: 97.6 F (36.4 C) (07/10 1700) Temp Source: Axillary (07/10 1700) BP: 123/74 mmHg (07/10 1607) Pulse Rate: 77 (07/10 0804) SPO2; FIO2:   Intake/Output Summary (Last 24 hours) at 04/28/15 1831 Last data filed at 04/28/15 1700  Gross per 24 hour  Intake 2147.5 ml  Output  751 ml  Net 1396.5 ml     Exam: General:  somnolent per RN Britta Mccreedy patient was able to tolerate his name and that he was in [place] today. Did state no when asked to speak to me. Does not follow commands.  Does not Opens eyes spontaneously, No acute respiratory distress Eyes:  retinal  hemorrhage ENT: Negative Runny nose,  Neck:  Negative scars, masses, torticollis, lymphadenopathy, JVD Lungs: Clear to auscultation bilaterally without wheezes or crackles Cardiovascular:  Regular  rate and rhythm without murmur gallop or rub normal S1 and S2 Abdomen:negative abdominal pain, negative dysphagia, Nontender, nondistended, soft, bowel sounds positive, no rebound, no ascites, no appreciable mass Extremities: No significant cyanosis, clubbing, or edema bilateral lower extremities Psychiatric:  Unable to assess Neurologic:  Patient withdraws all extremities to painful  Stimuli, states stop   Data Reviewed: Basic Metabolic Panel:  Recent Labs Lab 04/23/15 2142 04/24/15 1252 04/25/15 0220 04/26/15 0226 04/27/15 0232 04/28/15 0235  NA  --  145 145 145 148* 144  K  --  3.4* 3.4* 3.7 4.1 5.2*  CL  --  113* 114* 115* 117* 114*  CO2  --  23 20* 20* 23 21*  GLUCOSE  --  91 76 141* 126* 125*  BUN  --  27* 25* 17 10 <5*  CREATININE  --  1.34* 1.35* 1.16 0.93 0.95  CALCIUM  --  7.6* 7.9* 8.2* 8.6* 8.3*  MG 1.7  --  1.7 2.0 1.8 1.7  PHOS  --   --  3.0  --   --   --    Liver Function Tests:  Recent Labs Lab 04/22/15 2145 04/25/15 0220 04/26/15 0226 04/27/15 0232 04/28/15 0235  AST 46* 38 32 35 36  ALT 36 ALKPHOS 50 49 54 56 60  BILITOT 1.7* 1.0 0.7 0.5 0.7  PROT 5.2* 5.1* 5.1* 4.7* 5.0*  ALBUMIN 2.5* 2.0* 2.0* 2.0* 2.0*   No results for input(s): LIPASE, AMYLASE in the last 168 hours.  Recent Labs Lab 04/22/15 2145 04/26/15 0226  AMMONIA 16 11   CBC:  Recent Labs Lab 04/22/15 2145 04/23/15 0335 04/25/15 0220 04/26/15 0226 04/27/15 0232 04/28/15 0235  WBC 8.6 10.5 12.7* 9.5 7.0 6.8  NEUTROABS 8.0*  --   --  8.4* 5.8 5.5  HGB 9.9* 9.8* 9.4* 9.4* 9.4* 10.0*  HCT 29.0* 27.7* 27.8* 27.9* 28.4* 29.8*  MCV 97.6 94.2 96.2 97.6 96.9 98.7  PLT 151 144* 142* 160 177 185   Cardiac Enzymes:  Recent Labs Lab 04/22/15 2145 04/23/15 2142  04/24/15 0310 04/24/15 0735  CKTOTAL 533* 323  --   --   TROPONINI  --  0.06* 0.07* 0.06*   BNP (last 3 results) No results for input(s): BNP in the last 8760 hours.  ProBNP (last 3 results) No results for input(s): PROBNP in the last 8760 hours.  CBG:  Recent Labs Lab 04/27/15 2332 04/28/15 0345 04/28/15 0800 04/28/15 1041 04/28/15 1615  GLUCAP 98 113* 124* 87 114*    Recent Results (from the past 240 hour(s))  Blood culture (routine x 2)     Status: None   Collection Time: 04/22/15 10:00 PM  Result Value Ref Range Status   Specimen Description BLOOD LEFT ARM  Final   Special Requests   Final    BOTTLES DRAWN AEROBIC AND ANAEROBIC BLUE 10CC, RED 5CC   Culture NO GROWTH 5 DAYS  Final   Report Status 04/27/2015 FINAL  Final  Blood culture (routine x 2)     Status: None   Collection Time: 04/22/15 10:05 PM  Result Value Ref Range Status   Specimen Description BLOOD LEFT HAND  Final   Special Requests BOTTLES DRAWN AEROBIC ONLY 10CC  Final   Culture NO GROWTH 5 DAYS  Final   Report Status 04/27/2015 FINAL  Final  Urine culture     Status: None   Collection Time: 04/22/15 10:15 PM  Result Value Ref Range Status   Specimen Description URINE, CATHETERIZED  Final   Special Requests NONE  Final   Culture 1,000 COLONIES/mL INSIGNIFICANT GROWTH  Final   Report Status 04/24/2015 FINAL  Final  MRSA PCR Screening     Status: None   Collection Time: 04/23/15  4:12 AM  Result Value Ref Range Status   MRSA by PCR NEGATIVE NEGATIVE Final    Comment:        The GeneXpert MRSA Assay (FDA approved for NASAL specimens only), is one component of a comprehensive MRSA colonization surveillance program. It is not intended to diagnose MRSA infection nor to guide or monitor treatment for MRSA infections.      Studies:  Recent x-ray studies have been reviewed in detail by the Attending Physician  Scheduled Meds:  Scheduled Meds: . antiseptic oral rinse  7 mL Mouth Rinse  q12n4p  . chlorhexidine  15 mL Mouth Rinse BID  . enoxaparin (LOVENOX) injection  30 mg Subcutaneous Q24H  . folic acid  1 mg Intravenous Daily  . insulin aspart  0-9 Units Subcutaneous 6 times per day  . latanoprost  1 drop Both Eyes QHS  . thiamine  100 mg Intravenous Daily    Time spent on care of this patient: 40 mins   WOODS, Roselind Messier , MD  [facility] Office  [phone] Pager [phone]  On-Call/Text Page:      [email or web address]      password [email or web address]  If 7PM-7AM, please contact night-coverage [email or web address] Password [email or web address] 04/28/2015, 6:31 PM   LOS: 5 days   Care during the described time interval was provided by me .  I have reviewed this patient's available data, including medical history, events of note, physical examination, and all test results as part of my evaluation. I have personally reviewed and interpreted all radiology studies.   Carolyne Littles, MD [phone] Pager

## 2015-04-29 LAB — COMPREHENSIVE METABOLIC PANEL
ALBUMIN: 1.9 g/dL — AB (ref 3.5–5.0)
ALT: 28 U/L (ref 17–63)
AST: 34 U/L (ref 15–41)
Alkaline Phosphatase: 59 U/L (ref 38–126)
Anion gap: 5 (ref 5–15)
BUN: <5 mg/dL — ABNORMAL LOW (ref 6–20)
CO2: 25 mmol/L (ref 22–32)
Calcium: 8.1 mg/dL — ABNORMAL LOW (ref 8.9–10.3)
Chloride: 115 mmol/L — ABNORMAL HIGH (ref 101–111)
Creatinine, Ser: 1.01 mg/dL (ref 0.61–1.24)
GFR calc Af Amer: >60 mL/min (ref >60)
GFR calc non Af Amer: >60 mL/min (ref >60)
Glucose, Bld: 132 mg/dL — ABNORMAL HIGH (ref 65–99)
POTASSIUM: 4 mmol/L (ref 3.5–5.1)
Sodium: 145 mmol/L (ref 135–145)
Total Bilirubin: 0.8 mg/dL (ref 0.3–1.2)
Total Protein: 4.9 g/dL — ABNORMAL LOW (ref 6.5–8.1)

## 2015-04-29 LAB — GLUCOSE, CAPILLARY
GLUCOSE-CAPILLARY: 113 mg/dL — AB (ref 65–99)
Glucose-Capillary: 130 mg/dL — ABNORMAL HIGH (ref 65–99)
Glucose-Capillary: 115 mg/dL — ABNORMAL HIGH (ref 65–99)
Glucose-Capillary: 147 mg/dL — ABNORMAL HIGH (ref 65–99)
Glucose-Capillary: 108 mg/dL — ABNORMAL HIGH (ref 65–99)
Glucose-Capillary: 103 mg/dL — ABNORMAL HIGH (ref 65–99)
Glucose-Capillary: 98 mg/dL (ref 65–99)

## 2015-04-29 LAB — CBC WITH DIFFERENTIAL/PLATELET
BASOS ABS: 0 10*3/uL (ref 0.0–0.1)
BASOS PCT: 0 % (ref 0–1)
Eosinophils Absolute: 0.1 10*3/uL (ref 0.0–0.7)
Eosinophils Relative: 1 % (ref 0–5)
HCT: 27.3 % — ABNORMAL LOW (ref 39.0–52.0)
Hemoglobin: 9.3 g/dL — ABNORMAL LOW (ref 13.0–17.0)
LYMPHS ABS: 0.7 10*3/uL (ref 0.7–4.0)
LYMPHS PCT: 9 % — AB (ref 12–46)
MCH: 33.7 pg (ref 26.0–34.0)
MCHC: 34.1 g/dL (ref 30.0–36.0)
MCV: 98.9 fL (ref 78.0–100.0)
MONOS PCT: 10 % (ref 3–12)
Monocytes Absolute: 0.7 10*3/uL (ref 0.1–1.0)
NEUTROS PCT: 80 % — AB (ref 43–77)
Neutro Abs: 5.6 10*3/uL (ref 1.7–7.7)
Platelets: 199 10*3/uL (ref 150–400)
RBC: 2.76 MIL/uL — AB (ref 4.22–5.81)
RDW: 14.5 % (ref 11.5–15.5)
WBC: 7 10*3/uL (ref 4.0–10.5)

## 2015-04-29 LAB — MAGNESIUM: Magnesium: 1.5 mg/dL — ABNORMAL LOW (ref 1.7–2.4)

## 2015-04-29 MED ORDER — MAGNESIUM SULFATE 2 GM/50ML IV SOLN
2.0000 g | Freq: Once | INTRAVENOUS | Status: AC
  Administered 2015-04-29: 2 g via INTRAVENOUS
  Filled 2015-04-29: qty 50

## 2015-04-29 NOTE — Evaluation (Signed)
Clinical/Bedside Swallow Evaluation Patient Details  Name: Greg Scott MRN: [id number] Date of Birth: 1923/01/21  Today's Date: 04/29/2015 Time: SLP Start Time (ACUTE ONLY): 1030 SLP Stop Time (ACUTE ONLY): 1046 SLP Time Calculation (min) (ACUTE ONLY): 16 min  Past Medical History:  Past Medical History  Diagnosis Date  . Glaucoma   . Blind    Past Surgical History: History reviewed. No pertinent past surgical history. HPI:  Greg Scott is a 81 y.o. BM PMHx glaucoma/ blindness, lives at home with family. Has been doing more poorly over the last few months in terms of mobility. Pt's PCP thought that he might have a "pinched nerve" in his back causing bilat LE weakness and frequent falls that the pt was having. In the midst of these issues, the son went away for the weekend and a caretaker was supposed to look after Greg Scott. On returning they found the pt on the floor with dried feces/ urine, poorly responsive to brought to ED.    Assessment / Plan / Recommendation Clinical Impression  Pt demonstrates severe cognitive impairment impacting ability to consume PO. SLP utilized max verbal and tactile cueing, hand over hand assist to encourage awareness and acceptance of boluses without success. The pt tightly sealed lips the more tactile cues were provided. SLP administered tastes of water and applesauce by force in hopes of eliciting an automotic oral response, but pt did not actively transit and eventually coughed due to lack of swallow. Pt is not safe for PO at this time. Recommend short term alternate nutrition. SLP will continue efforts.     Aspiration Risk  Severe    Diet Recommendation NPO;Alternative means - temporary        Other  Recommendations Oral Care Recommendations: Oral care QID   Follow Up Recommendations       Frequency and Duration min 2x/week  2 weeks   Pertinent Vitals/Pain NA    SLP Swallow Goals     Swallow Study Prior Functional Status       General  Other Pertinent Information: Greg Scott is a 81 y.o. BM PMHx glaucoma/ blindness, lives at home with family. Has been doing more poorly over the last few months in terms of mobility. Pt's PCP thought that he might have a "pinched nerve" in his back causing bilat LE weakness and frequent falls that the pt was having. In the midst of these issues, the son went away for the weekend and a caretaker was supposed to look after Greg Scott. On returning they found the pt on the floor with dried feces/ urine, poorly responsive to brought to ED.  Type of Study: Bedside swallow evaluation Previous Swallow Assessment: none Diet Prior to this Study: NPO Temperature Spikes Noted: No Respiratory Status: Room air History of Recent Intubation: No Behavior/Cognition: Lethargic/Drowsy;Confused Oral Cavity - Dentition: Poor condition;Missing dentition Self-Feeding Abilities: Total assist Patient Positioning: Upright in bed Baseline Vocal Quality: Low vocal intensity Volitional Cough: Cognitively unable to elicit Volitional Swallow: Unable to elicit    Oral/Motor/Sensory Function Overall Oral Motor/Sensory Function:  (Pt would not follow commands)   Ice Chips     Thin Liquid Thin Liquid: Impaired Presentation: Cup;Straw;Self Fed Oral Phase Impairments: Poor awareness of bolus Oral Phase Functional Implications: Right anterior spillage;Left anterior spillage;Prolonged oral transit Pharyngeal  Phase Impairments: Cough - Delayed    Nectar Thick Nectar Thick Liquid: Not tested   Honey Thick Honey Thick Liquid: Not tested   Puree Puree: Impaired Presentation: Spoon;Self Fed  Oral Phase Impairments: Poor awareness of bolus   Solid   GO    Solid: Not tested      Harlon DittyBonnie Johnie Stadel, MA CCC-SLP [phone]  Kohner Orlick, Riley NearingBonnie Caroline 04/29/2015,10:52 AM

## 2015-04-29 NOTE — Progress Notes (Addendum)
[place] TEAM 1 - Stepdown/ICU TEAM Progress Note  Greg Scott ZOX:[id number] DOB: Jul 27, 1923 DOA: 04/22/2015 PCP: No primary care provider on file.  Admit HPI / Brief Narrative: 81 y.o. M Hx glaucoma/ blindness, lives at home with family. Has been doing poorly over the last few months in terms of mobility. Pt's PCP thought that he might have a "pinched nerve" in his back causing bilat LE weakness and frequent falls. In the midst of these issues, the son went away for the weekend and a caretaker was supposed to look after Greg Scott. On returning they found the pt on the floor with dried feces/ urine, poorly responsive.  He was brought to the ED.   In ED pt was hypothermic, BP 127/86, HR 88, temp 95.2. He looked dehydrated and rec'd about 4L IVF. Na 146 CO2 23 BUN 35 Creat 1.31. Alb 2.5, Ca 8.1, total bili 1.7, NH3 16. Lactic acid 2.1, WBC 8k, Hb 9.9, Plt 151  Trop 0.04  UA large small LE/ pos nitrite/ 3-6 wbc's  CT head showed some small WM changes, no acute CVA, no skull fracture  HPI/Subjective: The patient will turn his head somewhat but he does not answer the examiner's questions.  His speech is mumbling and garbled when he does interact.  There is no family present at the time of my evaluation.  Assessment/Plan:  Encephalopathy - collapse / found down  -Likely multifactorial to include UTI, alcohol abuse with probable withdrawal, ?seizure -MRI w/o evidence of CVA -EEG not helpful due to non-cooperation  -TSH normal - B12 & folate normal - ammonia normal  -etiology of persisting encephalopathy not clear, raising concern that this process may not be correctable  -off CIWA protocol and all sedating/altering meds -failed SLP eval - will place NG feeding tube to begin temporary trial of feeds   Alcohol abuse -Alcohol level negative - perhaps patient's presentation was due to acute alcohol withdrawal -urine drug screen negative  -CIWA protocol now completed   Urinary tract infection  with hematuria -Patient was started on Zosyn + vancomycin - no reason to be suspicious of MRSA  - narrow antibiotics to empiric Rocephin alone and follow culture   Rib fracture -Remote right fifth and sixth rib fractures; monitor for now - consistent with frequent falls and alcoholism  Hypokalemia -Replaced to goal or 4.0  Hypomagnesemia -replace and follow   Code Status: FULL Family Communication: No family present at time of exam  Disposition Plan: SDU to allow initial protection of NG tube   Consultants: NA  Procedure/Significant Events: 7/4 CT head without contrast;No acute intracranial pathology - Moderate cortical volume loss and scattered small vessel ischemic microangiopathy. 7/5 MRI brain without contrast -moderate atrophy -Negative for acute infarct or mass. Chronic micro hemorrhage right frontal white matter  Antibiotics: Zosyn 7/4 > 7/6 Vancomycin 7/4 > 7/6 Rocephin 7/6 > 7/9  DVT prophylaxis: lovenox  Objective: Blood pressure 129/79, pulse 86, temperature 98.8 F (37.1 C), temperature source Axillary, resp. rate 15, height  (1.803 m), weight 56.4 kg (124 lb 5.4 oz), SpO2 100 %.  Intake/Output Summary (Last 24 hours) at 04/29/15 1720 Last data filed at 04/29/15 1547  Gross per 24 hour  Intake   1350 ml  Output      0 ml  Net   1350 ml   Exam: General: No acute respiratory distress - altered - noncommunicative  Lungs: Clear to auscultation bilaterally without wheezes / crackles Cardiovascular: Regular rate and rhythm without murmur  gallop or rub  Abdomen: Nontender, nondistended, soft, bowel sounds positive, no rebound, no ascites, no appreciable mass Extremities: No significant cyanosis, or clubbing, trace edema bilateral lower extremities   Data Reviewed: Basic Metabolic Panel:  Recent Labs Lab 04/25/15 0220 04/26/15 0226 04/27/15 0232 04/28/15 0235 04/29/15 0218  NA 145 145 148* 144 145  K 3.4* 3.7 4.1 5.2* 4.0  CL 114* 115* 117* 114*  115*  CO2 20* 20* 23 21* 25  GLUCOSE 76 141* 126* 125* 132*  BUN 25* 17 10 <5* <5*  CREATININE 1.35* 1.16 0.93 0.95 1.01  CALCIUM 7.9* 8.2* 8.6* 8.3* 8.1*  MG 1.7 2.0 1.8 1.7 1.5*  PHOS 3.0  --   --   --   --    Liver Function Tests:  Recent Labs Lab 04/25/15 0220 04/26/15 0226 04/27/15 0232 04/28/15 0235 04/29/15 0218  AST 38 32 35 36 34  ALT 29 28 28 31 28   ALKPHOS 49 54 56 60 59  BILITOT 1.0 0.7 0.5 0.7 0.8  PROT 5.1* 5.1* 4.7* 5.0* 4.9*  ALBUMIN 2.0* 2.0* 2.0* 2.0* 1.9*    Recent Labs Lab 04/22/15 2145 04/26/15 0226  AMMONIA 16 11   CBC:  Recent Labs Lab 04/22/15 2145  04/25/15 0220 04/26/15 0226 04/27/15 0232 04/28/15 0235 04/29/15 0218  WBC 8.6  < > 12.7* 9.5 7.0 6.8 7.0  NEUTROABS 8.0*  --   --  8.4* 5.8 5.5 5.6  HGB 9.9*  < > 9.4* 9.4* 9.4* 10.0* 9.3*  HCT 29.0*  < > 27.8* 27.9* 28.4* 29.8* 27.3*  MCV 97.6  < > 96.2 97.6 96.9 98.7 98.9  PLT 151  < > 142* 160 177 185 199  < > = values in this interval not displayed. Cardiac Enzymes:  Recent Labs Lab 04/22/15 2145 04/23/15 2142 04/24/15 0310 04/24/15 0735  CKTOTAL 533* 323  --   --   TROPONINI  --  0.06* 0.07* 0.06*   CBG:  Recent Labs Lab 04/28/15 2320 04/29/15 0504 04/29/15 0801 04/29/15 1104 04/29/15 1546  GLUCAP 114* 130* 115* 108* 113*    Recent Results (from the past 240 hour(s))  Blood culture (routine x 2)     Status: None   Collection Time: 04/22/15 10:00 PM  Result Value Ref Range Status   Specimen Description BLOOD LEFT ARM  Final   Special Requests   Final    BOTTLES DRAWN AEROBIC AND ANAEROBIC BLUE 10CC, RED 5CC   Culture NO GROWTH 5 DAYS  Final   Report Status 04/27/2015 FINAL  Final  Blood culture (routine x 2)     Status: None   Collection Time: 04/22/15 10:05 PM  Result Value Ref Range Status   Specimen Description BLOOD LEFT HAND  Final   Special Requests BOTTLES DRAWN AEROBIC ONLY 10CC  Final   Culture NO GROWTH 5 DAYS  Final   Report Status 04/27/2015  FINAL  Final  Urine culture     Status: None   Collection Time: 04/22/15 10:15 PM  Result Value Ref Range Status   Specimen Description URINE, CATHETERIZED  Final   Special Requests NONE  Final   Culture 1,000 COLONIES/mL INSIGNIFICANT GROWTH  Final   Report Status 04/24/2015 FINAL  Final  MRSA PCR Screening     Status: None   Collection Time: 04/23/15  4:12 AM  Result Value Ref Range Status   MRSA by PCR NEGATIVE NEGATIVE Final    Comment:        The GeneXpert  MRSA Assay (FDA approved for NASAL specimens only), is one component of a comprehensive MRSA colonization surveillance program. It is not intended to diagnose MRSA infection nor to guide or monitor treatment for MRSA infections.      Studies:  Recent x-ray studies have been reviewed in detail by the Attending Physician  Scheduled Meds:  Scheduled Meds: . antiseptic oral rinse  7 mL Mouth Rinse q12n4p  . chlorhexidine  15 mL Mouth Rinse BID  . enoxaparin (LOVENOX) injection  30 mg Subcutaneous Q24H  . folic acid  1 mg Intravenous Daily  . insulin aspart  0-9 Units Subcutaneous 6 times per day  . latanoprost  1 drop Both Eyes QHS  . thiamine  100 mg Intravenous Daily    Time spent on care of this patient: 35 mins  Lonia BloodJeffrey T. McClung, MD [facility] For Consults/Admissions - Flow Manager - [phone] Office  [phone]  Contact MD directly via text page:      [email or web address]      password [email or web address]  04/29/2015, 5:20 PM   LOS: 6 days

## 2015-04-29 NOTE — Progress Notes (Signed)
Placed an [id number]. NGT patient immediately began pulling at the tube.  Attempted to secure the tube patient became very upset and removed the tube.  Physician notified.

## 2015-04-29 NOTE — Care Management (Signed)
Important Message  Patient Details  Name: Greg Scott MRN: [id number] Date of Birth: [birth year]   Medicare Important Message Given:  Yes-third notification given    Kyla BalzarineShealy, Kyley Solow Abena 04/29/2015, 2:48 PM

## 2015-04-30 DIAGNOSIS — N179 Acute kidney failure, unspecified: Secondary | ICD-10-CM

## 2015-04-30 DIAGNOSIS — R627 Adult failure to thrive: Secondary | ICD-10-CM

## 2015-04-30 DIAGNOSIS — E860 Dehydration: Secondary | ICD-10-CM

## 2015-04-30 LAB — COMPREHENSIVE METABOLIC PANEL
ALT: 27 U/L (ref 17–63)
ANION GAP: 7 (ref 5–15)
AST: 35 U/L (ref 15–41)
Albumin: 1.8 g/dL — ABNORMAL LOW (ref 3.5–5.0)
Alkaline Phosphatase: 55 U/L (ref 38–126)
BUN: 6 mg/dL (ref 6–20)
CHLORIDE: 115 mmol/L — AB (ref 101–111)
CO2: 21 mmol/L — ABNORMAL LOW (ref 22–32)
Calcium: 7.8 mg/dL — ABNORMAL LOW (ref 8.9–10.3)
Creatinine, Ser: 1.2 mg/dL (ref 0.61–1.24)
GFR calc Af Amer: 60 mL/min — ABNORMAL LOW (ref >60)
GFR, EST NON AFRICAN AMERICAN: 52 mL/min — AB (ref >60)
Glucose, Bld: 95 mg/dL (ref 65–99)
Potassium: 3.9 mmol/L (ref 3.5–5.1)
SODIUM: 143 mmol/L (ref 135–145)
Total Bilirubin: 0.7 mg/dL (ref 0.3–1.2)
Total Protein: 4.7 g/dL — ABNORMAL LOW (ref 6.5–8.1)

## 2015-04-30 LAB — GLUCOSE, CAPILLARY
GLUCOSE-CAPILLARY: 107 mg/dL — AB (ref 65–99)
Glucose-Capillary: 124 mg/dL — ABNORMAL HIGH (ref 65–99)
Glucose-Capillary: 140 mg/dL — ABNORMAL HIGH (ref 65–99)
Glucose-Capillary: 114 mg/dL — ABNORMAL HIGH (ref 65–99)
Glucose-Capillary: 95 mg/dL (ref 65–99)

## 2015-04-30 LAB — MAGNESIUM: Magnesium: 1.9 mg/dL (ref 1.7–2.4)

## 2015-04-30 MED ORDER — RESOURCE THICKENUP CLEAR PO POWD
ORAL | Status: DC | PRN
  Filled 2015-04-30: qty 125

## 2015-04-30 NOTE — Progress Notes (Signed)
Speech Language Pathology Treatment: Dysphagia  Patient Details Name: Greg Scott MRN: [id number] DOB: 03/28/1923 Today's Date: 04/30/2015 Time: [id number] SLP Time Calculation (min) (ACUTE ONLY): 20 min  Assessment / Plan / Recommendation Clinical Impression  Diagnostic treatment provided for diet initiation. Pt more alert and responsive though still quite confused and requiring max verbal and tactile cues to facilitate awareness of PO. With hand over hand assist and verbal instruction (pt is visually impaired) Pt was able to accept sips of liquids and bites of puree. Oral manipulation and swallow response very delayed, resulting in wet vocal quality and throat clearing, suspect probable penetration or even silent aspiration events. Pt holds head tilted back, despite max verbal and tactile cues, which worsens risk of premature spill to pharynx. Thickened liquids improved function. Limiting bolus size via teaspoon bites will also reduce risk. Recommend initiation of dys 1/honey thick liquids via teaspoon. Hopeful that as awareness improves, timing of swallow will also improve. Perhaps pt could participate in MBS in the future if necessary. Will follow for tolerance.    HPI Other Pertinent Information: Greg StallsFred A Regis is a 81 y.o. BM PMHx glaucoma/ blindness, lives at home with family. Has been doing more poorly over the last few months in terms of mobility. Pt's PCP thought that he might have a "pinched nerve" in his back causing bilat LE weakness and frequent falls that the pt was having. In the midst of these issues, the son went away for the weekend and a caretaker was supposed to look after [name]. On returning they found the pt on the floor with dried feces/ urine, poorly responsive to brought to ED.    Pertinent Vitals    SLP Plan  Continue with current plan of care    Recommendations Diet recommendations: Dysphagia 1 (puree);Honey-thick liquid Liquids provided via: Teaspoon Medication  Administration: Crushed with puree Supervision: Staff to assist with self feeding;Full supervision/cueing for compensatory strategies Compensations: Slow rate;Small sips/bites Postural Changes and/or Swallow Maneuvers: Seated upright 90 degrees              Oral Care Recommendations: Oral care BID Plan: Continue with current plan of care    GO    [facility] CahBonnie Brigitte Soderberg, MA CCC-SLP [phone]  Claudine MoutonDeBlois, Richie Vadala Caroline 04/30/2015, 9:01 AM

## 2015-04-30 NOTE — Progress Notes (Signed)
Report given to rn pt. To transfer to 6E30 via bed with belongings/

## 2015-04-30 NOTE — Progress Notes (Signed)
UR COMPLETED  

## 2015-04-30 NOTE — Progress Notes (Signed)
Nutrition Follow-up  DOCUMENTATION CODES:  Underweight  INTERVENTION:   Magic cup TID with meals, each supplement provides 290 kcal and 9 grams of protein.  Snacks ([employer] pudding) BID between meals, each snack will provide 120-130 kcals and 3 gm protein.  NUTRITION DIAGNOSIS:  Inadequate oral intake related to dysphagia as evidenced by meal completion < 25%.  Ongoing   GOAL:  Patient will meet greater than or equal to 90% of their needs  Unmet   MONITOR:  PO intake, Supplement acceptance, Labs, Weight trends  REASON FOR ASSESSMENT:  Consult Enteral/tube feeding initiation and management  ASSESSMENT:  81 y.o. Male with hx glaucoma/ blindness, lives at home with family. Has been doing more poorly over the last few months in terms of mobility. Pt's PCP thought that he might have a "pinched nerve" in his back causing bilat LE weakness and frequent falls that the pt was having. In the midst of these issues, the son went away for the weekend and a caretaker was supposed to look after [name]. On returning they found the pt on the floor with dried feces/ urine, poorly responsive to brought to ED.   Per discussion with RN, feeding tube was placed last night, but patient pulled it out. S/P swallow evaluation with SLP this AM and diet has been advanced to dysphagia 1 with honey thick liquids. No plans to replace feeding tube or start tube feeding at this time.  Diet Order:  DIET - DYS 1 Room service appropriate?: Yes; Fluid consistency:: Honey Thick  Skin:  Wound (see comment) (stage 1 to sacrum, DTI to foot)  Last BM:  7/4  Height:  Ht Readings from Last 1 Encounters:  04/23/15 5\' 11"  (1.803 m)    Weight:  Wt Readings from Last 1 Encounters:  04/23/15 124 lb 5.4 oz (56.4 kg)    Ideal Body Weight:  78.1 kg  Wt Readings from Last 10 Encounters:  04/23/15 124 lb 5.4 oz (56.4 kg)    BMI:  Body mass index is 17.35 kg/(m^2).  Estimated Nutritional  Needs:  Kcal:  1600-1800  Protein:  80-90 gm  Fluid:  1.6-1.8 L  EDUCATION NEEDS:  Education needs no appropriate at this time  Joaquin CourtsKimberly Harris, RD, LDN, CNSC Pager [phone] After Hours Pager [phone]

## 2015-04-30 NOTE — Progress Notes (Signed)
[place] TEAM 1 - Stepdown/ICU TEAM Progress Note  Greg Scott ZOX:[id number] DOB: June 06, 1923 DOA: 04/22/2015 PCP: No primary care provider on file.  Admit HPI / Brief Narrative: Greg Scott is a 81 y.o. BM PMHx glaucoma/ blindness, lives at home with family. Has been doing more poorly over the last few months in terms of mobility. Pt's PCP thought that he might have a "pinched nerve" in his back causing bilat LE weakness and frequent falls that the pt was having. In the midst of these issues, the son went away for the weekend and a caretaker was supposed to look after Greg Scott. On returning they found the pt on the floor with dried feces/ urine, poorly responsive to brought to ED.   In ED pt was hypothermic, BP 127/86, HR 88, temp 95.2. He looked dehydrated and has rec'd about 4L IVF's so far. Na 146 CO2 23 BUN 35 Creat 1.31. Alb 2.5, Ca 8.1, total bili 1.7, NH3 16. Lactic acid 2.1, WBC 8k, Hb 9.9, Plt 151 Trop 0.04 UA large small LE/ pos nitrite/ 3-6 wbc's CT head showed some small WM changes, no acute CVA, no skull fracture  HPI/Subjective: 7/12 A/O 1 (does not know where, when, why). 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 Follows most commands.   Assessment/Plan: Syncope and collapse -Likely multifactorial to include UTI, alcohol abuse (possible seizure); no evidence of stroke or MI -Echocardiogram; see results below -Troponin 3 mildly positive -Brain MRI/MRA; see results below -TSH within normal limit -Continue Sensitive SSI -Obtain EEG; normal  Alcohol abuse -Alcohol level negative - perhaps patient's presentation was due to acute alcohol withdrawal -urine drug screen negative  -Discontinued CIWA protocol, and all narcotics/sedating medication -Passed swallow study continue to D5W- 0.45% saline @75  ml/hr, until we are able to obtain that he can keep up with nutrition/hydration demand.  Acute  Encephalopathy/AMS -See syncope and fall -Discontinued all benzodiazepine and narcotics - Patient significantly improved  -Swallow study; passed; dysphagia 1 honey thick  Urinary tract infection with hematuria -Completed 5 day course antibiotics   Rib fracture -Remote right fifth and sixth rib fractures; monitor for now  Hypokalemia/Hyperkalemia -Potassium goal> 4 -Kayexalate 30 gm 1  Hypomagnesemia  -Magnesium goal > 2      Code Status: FULL Family Communication: None present at time of exam Disposition Plan: Improvement in acute encephalopathy   Consultants: NA  Procedure/Significant Events: 7/4 CT head without contrast;No acute intracranial pathology -Moderate cortical volume loss and scattered small vessel ischemic microangiopathy. 7/5 MRI brain without contrast; -moderate atrophy. -Negative for acute infarct or mass.  7/6 echocardiogram;- LVEF= 55%- 60%. Hypokinesis of the mid-apicalanteroseptal myocardium. 7/8 EEG; normal   Culture 7/4 blood left arm/hand NGTD 7/4 urine negative 7/5 MRSA by PCR negative   Antibiotics: Zosyn 7/4>> stopped 7/6 Vancomycin 7/4>> stopped 7/6  Rocephin 7/6>> stopped 7/10   DVT prophylaxis: Heparin subcutaneous   Devices    LINES / TUBES:      Continuous Infusions: . dextrose 5 % and 0.45% NaCl 75 mL/hr at 04/30/15 0400    Objective: VITAL SIGNS: Temp: 97.7 F (36.5 C) (07/12 0358) Temp Source: Axillary (07/12 0358) BP: 129/68 mmHg (07/12 0355) Pulse Rate: 78 (07/12 0355) SPO2; FIO2:   Intake/Output Summary (Last 24 hours) at 04/30/15 0701 Last data filed at 04/30/15 0400  Gross per 24 hour  Intake 1516.25 ml  Output      0 ml  Net 1516.25 ml     Exam: General:   A/O 1 (does not know where, when, why). 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, No acute respiratory distress Eyes:  Negative scleral  hemorrhage ENT: Negative Runny nose,  Neck:  Negative scars, masses, torticollis, lymphadenopathy, JVD Lungs: Clear to auscultation bilaterally without wheezes or crackles Cardiovascular:  Regular  rate and rhythm without murmur gallop or rub normal S1 and S2 Abdomen:negative abdominal pain, negative dysphagia, Nontender, nondistended, soft, bowel sounds positive, no rebound, no ascites, no appreciable mass Extremities: No significant cyanosis, clubbing, or edema bilateral lower extremities Psychiatric:  Unable to assess Neurologic:  Cranial nerves II through XII intact, tongue/uvula midline, extremity strength all extremities 4/5, sensation intact throughout, follows all commands. Did not ambulate patient    Data Reviewed: Basic Metabolic Panel:  Recent Labs Lab 04/25/15 0220 04/26/15 0226 04/27/15 0232 04/28/15 0235 04/29/15 0218 04/30/15 0235  NA 145 145 148* 144 145 143  K 3.4* 3.7 4.1 5.2* 4.0 3.9  CL 114* 115* 117* 114* 115* 115*  CO2 20* 20* 23 21* 25 21*  GLUCOSE 76 141* 126* 125* 132* 95  BUN 25* 17 10 <5* <5* 6  CREATININE 1.35* 1.16 0.93 0.95 1.01 1.20  CALCIUM 7.9* 8.2* 8.6* 8.3* 8.1* 7.8*  MG 1.7 2.0 1.8 1.7 1.5* 1.9  PHOS 3.0  --   --   --   --   --    Liver Function Tests:  Recent Labs Lab 04/26/15 0226 04/27/15 0232 04/28/15 0235 04/29/15 0218 04/30/15 0235  AST 32 35 36 34 35  ALT 28 28 31 28 27   ALKPHOS 54 56 60 59 55  BILITOT 0.7 0.5 0.7 0.8 0.7  PROT 5.1* 4.7* 5.0* 4.9* 4.7*  ALBUMIN 2.0* 2.0* 2.0* 1.9* 1.8*   No results for input(s): LIPASE, AMYLASE in the last 168 hours.  Recent Labs Lab 04/26/15 0226  AMMONIA 11   CBC:  Recent Labs Lab 04/25/15 0220 04/26/15 0226 04/27/15 0232 04/28/15 0235 04/29/15 0218  WBC 12.7* 9.5 7.0 6.8 7.0  NEUTROABS  --  8.4* 5.8 5.5 5.6  HGB 9.4* 9.4* 9.4* 10.0* 9.3*  HCT 27.8* 27.9* 28.4* 29.8* 27.3*  MCV 96.2 97.6 96.9 98.7 98.9  PLT 142* 160 177 185 199   Cardiac Enzymes:  Recent Labs Lab  04/23/15 2142 04/24/15 0310 04/24/15 0735  CKTOTAL 323  --   --   TROPONINI 0.06* 0.07* 0.06*   BNP (last 3 results) No results for input(s): BNP in the last 8760 hours.  ProBNP (last 3 results) No results for input(s): PROBNP in the last 8760 hours.  CBG:  Recent Labs Lab 04/29/15 0801 04/29/15 1104 04/29/15 1546 04/29/15 1913 04/29/15 2259  GLUCAP 115* 108* 113* 103* 98    Recent Results (from the past 240 hour(s))  Blood culture (routine x 2)     Status: None   Collection Time: 04/22/15 10:00 PM  Result Value Ref Range Status   Specimen Description BLOOD LEFT ARM  Final   Special Requests   Final    BOTTLES DRAWN AEROBIC AND ANAEROBIC BLUE 10CC, RED 5CC   Culture NO GROWTH 5 DAYS  Final   Report Status 04/27/2015 FINAL  Final  Blood culture (routine x 2)     Status: None   Collection Time: 04/22/15 10:05 PM  Result Value Ref Range Status   Specimen Description BLOOD LEFT HAND  Final   Special Requests BOTTLES DRAWN AEROBIC ONLY 10CC  Final   Culture NO GROWTH 5 DAYS  Final   Report Status 04/27/2015 FINAL  Final  Urine culture     Status: None   Collection Time: 04/22/15 10:15 PM  Result Value Ref Range Status   Specimen Description URINE, CATHETERIZED  Final   Special Requests NONE  Final   Culture 1,000 COLONIES/mL INSIGNIFICANT GROWTH  Final   Report Status 04/24/2015 FINAL  Final  MRSA PCR Screening     Status: None   Collection Time: 04/23/15  4:12 AM  Result Value Ref Range Status   MRSA by PCR NEGATIVE NEGATIVE Final    Comment:        The GeneXpert MRSA Assay (FDA approved for NASAL specimens only), is one component of a comprehensive MRSA colonization surveillance program. It is not intended to diagnose MRSA infection nor to guide or monitor treatment for MRSA infections.      Studies:  Recent x-ray studies have been reviewed in detail by the Attending Physician  Scheduled Meds:  Scheduled Meds: . antiseptic oral rinse  7 mL Mouth  Rinse q12n4p  . chlorhexidine  15 mL Mouth Rinse BID  . enoxaparin (LOVENOX) injection  30 mg Subcutaneous Q24H  . folic acid  1 mg Intravenous Daily  . insulin aspart  0-9 Units Subcutaneous 6 times per day  . latanoprost  1 drop Both Eyes QHS  . thiamine  100 mg Intravenous Daily    Time spent on care of this patient: 40 mins   WOODS, Roselind Messier , MD  [facility] Office  [phone] Pager [phone]  On-Call/Text Page:      [email or web address]      password [email or web address]  If 7PM-7AM, please contact night-coverage [email or web address] Password [email or web address] 04/30/2015, 7:01 AM   LOS: 7 days   Care during the described time interval was provided by me .  I have reviewed this patient's available data, including medical history, events of note, physical examination, and all test results as part of my evaluation. I have personally reviewed and interpreted all radiology studies.   Carolyne Littles, MD [phone] Pager

## 2015-04-30 NOTE — Progress Notes (Addendum)
Discussed in LOS meeting. 

## 2015-04-30 NOTE — Progress Notes (Signed)
NG tube placed verified by 2 rn's to right nare will continue monitor

## 2015-04-30 NOTE — Progress Notes (Signed)
Pt transferred to 6E30 via bed. Notified son of new room number.

## 2015-05-01 DIAGNOSIS — L8990 Pressure ulcer of unspecified site, unspecified stage: Secondary | ICD-10-CM

## 2015-05-01 LAB — GLUCOSE, CAPILLARY
GLUCOSE-CAPILLARY: 107 mg/dL — AB (ref 65–99)
GLUCOSE-CAPILLARY: 84 mg/dL (ref 65–99)
GLUCOSE-CAPILLARY: 88 mg/dL (ref 65–99)
Glucose-Capillary: 96 mg/dL (ref 65–99)
Glucose-Capillary: 81 mg/dL (ref 65–99)
Glucose-Capillary: 81 mg/dL (ref 65–99)

## 2015-05-01 LAB — BASIC METABOLIC PANEL
Anion gap: 5 (ref 5–15)
BUN: 5 mg/dL — ABNORMAL LOW (ref 6–20)
CALCIUM: 7.8 mg/dL — AB (ref 8.9–10.3)
CHLORIDE: 114 mmol/L — AB (ref 101–111)
CO2: 23 mmol/L (ref 22–32)
CREATININE: 0.98 mg/dL (ref 0.61–1.24)
GFR calc Af Amer: >60 mL/min (ref >60)
GFR calc non Af Amer: >60 mL/min (ref >60)
Glucose, Bld: 109 mg/dL — ABNORMAL HIGH (ref 65–99)
POTASSIUM: 3.3 mmol/L — AB (ref 3.5–5.1)
SODIUM: 142 mmol/L (ref 135–145)

## 2015-05-01 LAB — CLOSTRIDIUM DIFFICILE BY PCR: Toxigenic C. Difficile by PCR: NEGATIVE

## 2015-05-01 LAB — CBC
HCT: 25.4 % — ABNORMAL LOW (ref 39.0–52.0)
HEMOGLOBIN: 8.5 g/dL — AB (ref 13.0–17.0)
MCH: 32.2 pg (ref 26.0–34.0)
MCHC: 33.5 g/dL (ref 30.0–36.0)
MCV: 96.2 fL (ref 78.0–100.0)
PLATELETS: 252 10*3/uL (ref 150–400)
RBC: 2.64 MIL/uL — AB (ref 4.22–5.81)
RDW: 14.1 % (ref 11.5–15.5)
WBC: 7 10*3/uL (ref 4.0–10.5)

## 2015-05-01 LAB — MAGNESIUM: Magnesium: 1.8 mg/dL (ref 1.7–2.4)

## 2015-05-01 NOTE — Progress Notes (Addendum)
[facility] [address] [facility]. Patient is alert&oriented to person. Left AC IV with D5 1/2 NS running at 75, IV wrapped with Kerlex. Mittens present on both hands. Skin: Bruise on left hip and abrasion (red) present on the right hip (foam applied), Deep tissue injury (2.0x2.0) present on right heel (foam dressing and prevalon boot  present), Stage 2 pressure ulcer on the sacrum (3.0x2.0), MSAD on both left and right buttocks.  Patient placed on tele box 19, CCMD notified. Bed in the lowest position, call bell placed within reach and bed alarm activated. No family present.

## 2015-05-01 NOTE — Clinical Social Work Note (Signed)
CSW reviewed chart and patient has bilat LE weakness and frequent falls, and may be in need of SNF placement for ST rehab. CSW attempted to reach son, Cammy CopaFred Scott [phone]) and message left.   Genelle BalVanessa Fusae Scott, MSW, LCSW Licensed Clinical Social Worker Clinical Social Work Department [facility] [phone]

## 2015-05-01 NOTE — Clinical Documentation Improvement (Signed)
PLEASE NOTE IF PRESENT ON ADMISSION  Possible Clinical Conditions?   Stage  I  Pressure Ulcer   (reddening of the skin) Stage  II Pressure Ulcer  (blister open or unopened) Stage  III Pressure Ulcer (through all layers skin) Stage IV Pressure Ulcer   (through skin & underlying  muscle, tendons, and bones) Other Condition Cannot Clinically Determine   Supporting Information:( As per notes)RN noted on 05-01-15  "Skin: Bruise on left hip and &abrasion (red) present on the right hip (foam applied), Deep tissue injury (2.0x2.0) present on right heel (foam dressing and prevalon boot present), Stage 2 pressure ulcer on the sacrum (3.0x2.0), MSAD on both left and right buttocks.  Patient placed on tele box 19, CCMD notified. "  Wound Nurse Assessment (WOC):No Assessment done at this point  Thank You, Nevin BloodgoodJoan B Cebastian Neis, RN, BSN, CCDS,Clinical Documentation Specialist:  [phone]  [phone]=[employer]- Health Information Management

## 2015-05-01 NOTE — Progress Notes (Signed)
Speech Language Pathology Treatment: Dysphagia  Patient Details Name: Greg StallsFred A Hedtke MRN: [id number] DOB: [birth year] Today's Date: 05/01/2015 Time: [id number] SLP Time Calculation (min) (ACUTE ONLY): 8 min  Assessment / Plan / Recommendation Clinical Impression  Pt refused food/liquid with this SLP after max encouragement. Pt awake/drowsy, speaking with unrelated topics. RN tech reported pt holding food in oral cavity at lunch requiring cues however not able to follow, therefore lunch ceased due to fear of pt aspirating. She stated he consumed approximately 6 bites. RN charting reveals pt is afebrile and lung sounds unchanged (clear/diminished). Pt at high aspiration/pna risk; no family present to educate. Will follow briefly.   HPI Other Pertinent Information: Greg StallsFred A Hohensee is a 81 y.o. BM PMHx glaucoma/ blindness, lives at home with family. Has been doing more poorly over the last few months in terms of mobility. Pt's PCP thought that he might have a "pinched nerve" in his back causing bilat LE weakness and frequent falls that the pt was having. In the midst of these issues, the son went away for the weekend and a caretaker was supposed to look after [name]. On returning they found the pt on the floor with dried feces/ urine, poorly responsive to brought to ED.    Pertinent Vitals Pain Assessment: No/denies pain  SLP Plan  Continue with current plan of care    Recommendations Diet recommendations: Honey-thick liquid;Dysphagia 1 (puree) (liquid via spoon) Liquids provided via: Teaspoon Medication Administration: Crushed with puree Supervision: Staff to assist with self feeding;Full supervision/cueing for compensatory strategies Compensations: Slow rate;Small sips/bites;Check for pocketing Postural Changes and/or Swallow Maneuvers: Seated upright 90 degrees              Oral Care Recommendations: Oral care BID Follow up Recommendations: Skilled Nursing facility Plan: Continue with current  plan of care        Royce MacadamiaLitaker, Sourish Allender Willis 05/01/2015, 1:31 PM   Breck CoonsLisa Willis Lonell FaceLitaker M.Ed [employer]-SLP Pager [phone]

## 2015-05-01 NOTE — Progress Notes (Signed)
[facility] PROGRESS NOTE  Greg Scott:[id number] DOB: 07-30-1923 DOA: 04/22/2015 PCP: No primary care provider on file.  Admit HPI / Brief Narrative: Greg Scott is a 81 y.o. BM PMHx glaucoma/ blindness, lives at home with family. Has been doing more poorly over the last few months in terms of mobility. Pt's PCP thought that he might have a "pinched nerve" in his back causing bilat LE weakness and frequent falls that the pt was having. In the midst of these issues, the son went away for the weekend and a caretaker was supposed to look after [name]. On returning they found the pt on the floor with dried feces/ urine, poorly responsive to brought to ED.   In ED pt was hypothermic, BP 127/86, HR 88, temp 95.2. He looked dehydrated and has rec'd about 4L IVF's so far. Na 146 CO2 23 BUN 35 Creat 1.31. Alb 2.5, Ca 8.1, total bili 1.7, NH3 16. Lactic acid 2.1, WBC 8k, Hb 9.9, Plt 151 Trop 0.04 UA large small LE/ pos nitrite/ 3-6 wbc's CT head showed some small WM changes, no acute CVA, no skull fracture  Assessment/Plan: Syncope and collapse -Likely multifactorial to include UTI, alcohol abuse (possible seizure); no evidence of stroke or MI -Echocardiogram; see results below -Troponin 3 mildly positive -Brain MRI/MRA negative for acute infarct or masses. -TSH within normal limit -Continue Sensitive SSI -EEG; normal  Alcohol abuse -Alcohol level negative - perhaps patient's presentation was due to acute alcohol withdrawal -urine drug screen negative  -Discontinued CIWA protocol, and all narcotics/sedating medication -Passed swallow study continue to D5W- 0.45% saline  ml/hr, until we are able to obtain that he can keep up with nutrition/hydration demand.  Acute Encephalopathy/AMS -See syncope and fall -Discontinued all benzodiazepine and narcotics - Patient improving.  -Swallow study; passed; dysphagia 1 honey thick  Urinary tract infection with hematuria -Completed  5 day course antibiotics  Rib fracture -Remote right fifth and sixth rib fractures; monitor for now  Hypokalemia/Hyperkalemia -Potassium goal> 4 -Kayexalate 30 gm 1 for hyperkalemia.  Hypomagnesemia  -Replete. Keep magnesium goal > 2   Stage II sacral decubitus Continue frequent turns.  Prophylaxis Heparin for DVT prophylaxis.     Code Status: Full Family Communication: Updated patient. No family at bedside. Disposition Plan: SNF versus home with home health pending PT evaluation, and improvement of encephalopathy back to baseline.   Consultants:  None  Procedures: 7/4 CT head without contrast;No acute intracranial pathology -Moderate cortical volume loss and scattered small vessel ischemic microangiopathy. 7/5 MRI brain without contrast; -moderate atrophy. -Negative for acute infarct or mass.  7/6 echocardiogram;- LVEF= 55%- 60%. Hypokinesis of the mid-apicalanteroseptal myocardium. 7/8 EEG; normal  Culture 7/4 blood left arm/hand NGTD 7/4 urine negative 7/5 MRSA by PCR negative   Antibiotics:  IV Zosyn 04/22/15 >>> 04/24/15  IV vancomycin 04/22/2015>>>>> 04/24/2015  IV Rocephin 04/24/2015>>>>>> 04/28/2015  HPI/Subjective: Patient sleeping however easily arousable. Patient denies any chest pain. Patient denies any shortness of breath. Patient has mittens on.  Objective: Filed Vitals:   05/01/15 0818  BP: 136/71  Pulse: 87  Temp: 98.6 F (37 C)  Resp: 18    Intake/Output Summary (Last 24 hours) at 05/01/15 1542 Last data filed at 05/01/15 1356  Gross per 24 hour  Intake   1275 ml  Output    450 ml  Net    825 ml   Filed Weights   04/22/15 2114 04/23/15 0330  Weight: 54.432 kg (120 lb) 56.4 kg (  124 lb 5.4 oz)    Exam:   General:  NAD  Cardiovascular: RRR  Respiratory: CTAB  Abdomen: Soft/NT/ND/+BS  Musculoskeletal: No c/c/e  Data Reviewed: Basic Metabolic Panel:  Recent Labs Lab 04/25/15 0220  04/27/15 0232  04/28/15 0235 04/29/15 0218 04/30/15 0235 05/01/15 0930  NA 145  < > 148* 144 145 143 142  K 3.4*  < > 4.1 5.2* 4.0 3.9 3.3*  CL 114*  < > 117* 114* 115* 115* 114*  CO2 20*  < > 23 21* 25 21* 23  GLUCOSE 76  < > 126* 125* 132* 95 109*  BUN 25*  < > 10 <5* <5* 6 5*  CREATININE 1.35*  < > 0.93 0.95 1.01 1.20 0.98  CALCIUM 7.9*  < > 8.6* 8.3* 8.1* 7.8* 7.8*  MG 1.7  < > 1.8 1.7 1.5* 1.9 1.8  PHOS 3.0  --   --   --   --   --   --   < > = values in this interval not displayed. Liver Function Tests:  Recent Labs Lab 04/26/15 0226 04/27/15 0232 04/28/15 0235 04/29/15 0218 04/30/15 0235  AST 32 35 36 34 35  ALT 28 28 31 28 27   ALKPHOS 54 56 60 59 55  BILITOT 0.7 0.5 0.7 0.8 0.7  PROT 5.1* 4.7* 5.0* 4.9* 4.7*  ALBUMIN 2.0* 2.0* 2.0* 1.9* 1.8*   No results for input(s): LIPASE, AMYLASE in the last 168 hours.  Recent Labs Lab 04/26/15 0226  AMMONIA 11   CBC:  Recent Labs Lab 04/26/15 0226 04/27/15 0232 04/28/15 0235 04/29/15 0218 05/01/15 0930  WBC 9.5 7.0 6.8 7.0 7.0  NEUTROABS 8.4* 5.8 5.5 5.6  --   HGB 9.4* 9.4* 10.0* 9.3* 8.5*  HCT 27.9* 28.4* 29.8* 27.3* 25.4*  MCV 97.6 96.9 98.7 98.9 96.2  PLT 160 177 185 199 252   Cardiac Enzymes: No results for input(s): CKTOTAL, CKMB, CKMBINDEX, TROPONINI in the last 168 hours. BNP (last 3 results) No results for input(s): BNP in the last 8760 hours.  ProBNP (last 3 results) No results for input(s): PROBNP in the last 8760 hours.  CBG:  Recent Labs Lab 04/30/15 2037 04/30/15 2355 05/01/15 0302 05/01/15 0817 05/01/15 1205  GLUCAP 95 88 96 107* 84    Recent Results (from the past 240 hour(s))  Blood culture (routine x 2)     Status: None   Collection Time: 04/22/15 10:00 PM  Result Value Ref Range Status   Specimen Description BLOOD LEFT ARM  Final   Special Requests   Final    BOTTLES DRAWN AEROBIC AND ANAEROBIC BLUE 10CC, RED 5CC   Culture NO GROWTH 5 DAYS  Final   Report Status 04/27/2015 FINAL  Final   Blood culture (routine x 2)     Status: None   Collection Time: 04/22/15 10:05 PM  Result Value Ref Range Status   Specimen Description BLOOD LEFT HAND  Final   Special Requests BOTTLES DRAWN AEROBIC ONLY 10CC  Final   Culture NO GROWTH 5 DAYS  Final   Report Status 04/27/2015 FINAL  Final  Urine culture     Status: None   Collection Time: 04/22/15 10:15 PM  Result Value Ref Range Status   Specimen Description URINE, CATHETERIZED  Final   Special Requests NONE  Final   Culture 1,000 COLONIES/mL INSIGNIFICANT GROWTH  Final   Report Status 04/24/2015 FINAL  Final  MRSA PCR Screening     Status: None  Collection Time: 04/23/15  4:12 AM  Result Value Ref Range Status   MRSA by PCR NEGATIVE NEGATIVE Final    Comment:        The GeneXpert MRSA Assay (FDA approved for NASAL specimens only), is one component of a comprehensive MRSA colonization surveillance program. It is not intended to diagnose MRSA infection nor to guide or monitor treatment for MRSA infections.      Studies: No results found.  Scheduled Meds: . antiseptic oral rinse  7 mL Mouth Rinse q12n4p  . chlorhexidine  15 mL Mouth Rinse BID  . enoxaparin (LOVENOX) injection  30 mg Subcutaneous Q24H  . folic acid  1 mg Intravenous Daily  . insulin aspart  0-9 Units Subcutaneous 6 times per day  . latanoprost  1 drop Both Eyes QHS  . thiamine  100 mg Intravenous Daily   Continuous Infusions: . dextrose 5 % and 0.45% NaCl 75 mL/hr at 04/30/15 2222    Principal Problem:   Altered mental status Active Problems:   Volume depletion   Failure to thrive in adult   Blind   Glaucoma   Pyuria   Syncope and collapse   Alcohol abuse   Acute cystitis with hematuria   Fractured rib   Hypokalemia   Pressure ulcer   UTI (lower urinary tract infection)   Hypomagnesemia   Acute encephalopathy   Urinary tract infectious disease   Rib fracture   Encephalopathy acute   Hyperkalemia    Time spent: 35  minutes    THOMPSON,DANIEL M.D. [facility] Pager [phone]. If 7PM-7AM, please contact night-coverage at [email or web address], password [facility] 05/01/2015, 3:42 PM  LOS: 8 days

## 2015-05-02 DIAGNOSIS — E43 Unspecified severe protein-calorie malnutrition: Secondary | ICD-10-CM | POA: Insufficient documentation

## 2015-05-02 LAB — BASIC METABOLIC PANEL
Anion gap: 8 (ref 5–15)
BUN: <5 mg/dL — ABNORMAL LOW (ref 6–20)
CHLORIDE: 109 mmol/L (ref 101–111)
CO2: 24 mmol/L (ref 22–32)
Calcium: 8 mg/dL — ABNORMAL LOW (ref 8.9–10.3)
Creatinine, Ser: 1.06 mg/dL (ref 0.61–1.24)
GFR calc Af Amer: >60 mL/min (ref >60)
GFR calc non Af Amer: >60 mL/min (ref >60)
Glucose, Bld: 94 mg/dL (ref 65–99)
Potassium: 2.9 mmol/L — ABNORMAL LOW (ref 3.5–5.1)
Sodium: 141 mmol/L (ref 135–145)

## 2015-05-02 LAB — CBC
HEMATOCRIT: 27.2 % — AB (ref 39.0–52.0)
Hemoglobin: 9.1 g/dL — ABNORMAL LOW (ref 13.0–17.0)
MCH: 32.2 pg (ref 26.0–34.0)
MCHC: 33.5 g/dL (ref 30.0–36.0)
MCV: 96.1 fL (ref 78.0–100.0)
Platelets: 300 10*3/uL (ref 150–400)
RBC: 2.83 MIL/uL — AB (ref 4.22–5.81)
RDW: 14.1 % (ref 11.5–15.5)
WBC: 8.1 10*3/uL (ref 4.0–10.5)

## 2015-05-02 LAB — GLUCOSE, CAPILLARY
GLUCOSE-CAPILLARY: 89 mg/dL (ref 65–99)
GLUCOSE-CAPILLARY: 74 mg/dL (ref 65–99)
Glucose-Capillary: 82 mg/dL (ref 65–99)
Glucose-Capillary: 73 mg/dL (ref 65–99)

## 2015-05-02 LAB — MAGNESIUM: Magnesium: 1.7 mg/dL (ref 1.7–2.4)

## 2015-05-02 LAB — POTASSIUM: Potassium: 3.3 mmol/L — ABNORMAL LOW (ref 3.5–5.1)

## 2015-05-02 MED ORDER — POTASSIUM CHLORIDE CRYS ER 20 MEQ PO TBCR
40.0000 meq | EXTENDED_RELEASE_TABLET | Freq: Once | ORAL | Status: AC
  Administered 2015-05-02: 40 meq via ORAL
  Filled 2015-05-02: qty 2

## 2015-05-02 MED ORDER — POTASSIUM CHLORIDE CRYS ER 20 MEQ PO TBCR
40.0000 meq | EXTENDED_RELEASE_TABLET | ORAL | Status: AC
  Administered 2015-05-02 (×2): 40 meq via ORAL
  Filled 2015-05-02 (×2): qty 2

## 2015-05-02 MED ORDER — ENSURE ENLIVE PO LIQD
237.0000 mL | Freq: Three times a day (TID) | ORAL | Status: DC
  Administered 2015-05-02 – 2015-05-10 (×11): 237 mL via ORAL

## 2015-05-02 MED ORDER — MAGNESIUM SULFATE 50 % IJ SOLN
3.0000 g | Freq: Once | INTRAVENOUS | Status: AC
  Administered 2015-05-02: 3 g via INTRAVENOUS
  Filled 2015-05-02: qty 6

## 2015-05-02 NOTE — Progress Notes (Signed)
[facility] PROGRESS NOTE  Greg Scott Greg Scott DOB: April 16, 1923 DOA: 04/22/2015 PCP: No primary care provider on file.  Admit HPI / Brief Narrative: Greg Scott is a 81 y.o. BM PMHx glaucoma/ blindness, lives at home with family. Has been doing more poorly over the last few months in terms of mobility. Pt's PCP thought that he might have a "pinched nerve" in his back causing bilat LE weakness and frequent falls that the pt was having. In the midst of these issues, the son went away for the weekend and a caretaker was supposed to look after Mr Greg Scott. On returning they found the pt on the floor with dried feces/ urine, poorly responsive to brought to ED.   In ED pt was hypothermic, BP 127/86, HR 88, temp 95.2. He looked dehydrated and has rec'd about 4L IVF's so far. Na 146 CO2 23 BUN 35 Creat 1.31. Alb 2.5, Ca 8.1, total bili 1.7, NH3 16. Lactic acid 2.1, WBC 8k, Hb 9.9, Plt 151 Trop 0.04 UA large small LE/ pos nitrite/ 3-6 wbc's CT head showed some small WM changes, no acute CVA, no skull fracture  Assessment/Plan: Syncope and collapse -Likely multifactorial to include UTI, alcohol abuse (possible seizure); no evidence of stroke or MI -Echocardiogram; see results below -Troponin 3 mildly positive -Brain MRI/MRA negative for acute infarct or masses. -TSH within normal limit -Continue Sensitive SSI -EEG; normal  Alcohol abuse -Alcohol level negative - perhaps patient's presentation was due to acute alcohol withdrawal -urine drug screen negative  -Discontinued CIWA protocol, and all narcotics/sedating medication -Passed swallow study continue to D5W- 0.45% saline @50  ml/hr, until we are able to obtain that he can keep up with nutrition/hydration demand.  Acute Encephalopathy/AMS -See syncope and fall -Discontinued all benzodiazepine and narcotics - Patient improving.  -Swallow study; passed; dysphagia 1 honey thick  Urinary tract infection with hematuria -Completed  5 day course antibiotics  Rib fracture -Remote right fifth and sixth rib fractures; monitor for now  Hypokalemia/Hyperkalemia -Potassium goal> 4 -Kayexalate 30 gm 1 for hyperkalemia.  Hypomagnesemia  -Replete. Keep magnesium goal > 2   Stage II sacral decubitus Continue frequent turns.  Prophylaxis Heparin for DVT prophylaxis.     Code Status: Full Family Communication: Updated patient. No family at bedside. Disposition Plan: SNF    Consultants:  None  Procedures: 7/4 CT head without contrast;No acute intracranial pathology -Moderate cortical volume loss and scattered small vessel ischemic microangiopathy. 7/5 MRI brain without contrast; -moderate atrophy. -Negative for acute infarct or mass.  7/6 echocardiogram;- LVEF= 55%- 60%. Hypokinesis of the mid-apicalanteroseptal myocardium. 7/8 EEG; normal  Culture 7/4 blood left arm/hand NGTD 7/4 urine negative 7/5 MRSA by PCR negative   Antibiotics:  IV Zosyn 04/22/15 >>> 04/24/15  IV vancomycin 04/22/2015>>>>> 04/24/2015  IV Rocephin 04/24/2015>>>>>> 04/28/2015  HPI/Subjective: Patient alert and following some commands. Patient denies chest pain. No shortness of breath. Patient would mittens on. Patient with some occasional confusion.  Objective: Filed Vitals:   05/02/15 0930  BP: 152/66  Pulse: 90  Temp: 97.4 F (36.3 C)  Resp: 18    Intake/Output Summary (Last 24 hours) at 05/02/15 1605 Last data filed at 05/02/15 1308  Gross per 24 hour  Intake 1902.5 ml  Output    600 ml  Net 1302.5 ml   Filed Weights   04/22/15 2114 04/23/15 0330 05/02/15 0322  Weight: 54.432 kg (120 lb) 56.4 kg (124 lb 5.4 oz) 63.3 kg (139 lb 8.8 oz)    Exam:  General:  NAD. Mittens on  Cardiovascular: RRR  Respiratory: CTAB  Abdomen: Soft/NT/ND/+BS  Musculoskeletal: No c/c/e  Data Reviewed: Basic Metabolic Panel:  Recent Labs Lab 04/28/15 0235 04/29/15 0218 04/30/15 0235 05/01/15 0930  05/02/15 0612 05/02/15 1525  NA 144 145 143 142 141  --   K 5.2* 4.0 3.9 3.3* 2.9* 3.3*  CL 114* 115* 115* 114* 109  --   CO2 21* 25 21* 23 24  --   GLUCOSE 125* 132* 95 109* 94  --   BUN <5* <5* 6 5* <5*  --   CREATININE 0.95 1.01 1.20 0.98 1.06  --   CALCIUM 8.3* 8.1* 7.8* 7.8* 8.0*  --   MG 1.7 1.5* 1.9 1.8 1.7  --    Liver Function Tests:  Recent Labs Lab 04/26/15 0226 04/27/15 0232 04/28/15 0235 04/29/15 0218 04/30/15 0235  AST 32 35 36 34 35  ALT 28 28 31 28 27   ALKPHOS 54 56 60 59 55  BILITOT 0.7 0.5 0.7 0.8 0.7  PROT 5.1* 4.7* 5.0* 4.9* 4.7*  ALBUMIN 2.0* 2.0* 2.0* 1.9* 1.8*   No results for input(s): LIPASE, AMYLASE in the last 168 hours.  Recent Labs Lab 04/26/15 0226  AMMONIA 11   CBC:  Recent Labs Lab 04/26/15 0226 04/27/15 0232 04/28/15 0235 04/29/15 0218 05/01/15 0930 05/02/15 0612  WBC 9.5 7.0 6.8 7.0 7.0 8.1  NEUTROABS 8.4* 5.8 5.5 5.6  --   --   HGB 9.4* 9.4* 10.0* 9.3* 8.5* 9.1*  HCT 27.9* 28.4* 29.8* 27.3* 25.4* 27.2*  MCV 97.6 96.9 98.7 98.9 96.2 96.1  PLT 160 177 185 199 252 300   Cardiac Enzymes: No results for input(s): CKTOTAL, CKMB, CKMBINDEX, TROPONINI in the last 168 hours. BNP (last 3 results) No results for input(s): BNP in the last 8760 hours.  ProBNP (last 3 results) No results for input(s): PROBNP in the last 8760 hours.  CBG:  Recent Labs Lab 05/01/15 1703 05/01/15 2312 05/02/15 0317 05/02/15 0854 05/02/15 1151  GLUCAP 81 81 82 73 89    Recent Results (from the past 240 hour(s))  Blood culture (routine x 2)     Status: None   Collection Time: 04/22/15 10:00 PM  Result Value Ref Range Status   Specimen Description BLOOD LEFT ARM  Final   Special Requests   Final    BOTTLES DRAWN AEROBIC AND ANAEROBIC BLUE 10CC, RED 5CC   Culture NO GROWTH 5 DAYS  Final   Report Status 04/27/2015 FINAL  Final  Blood culture (routine x 2)     Status: None   Collection Time: 04/22/15 10:05 PM  Result Value Ref Range  Status   Specimen Description BLOOD LEFT HAND  Final   Special Requests BOTTLES DRAWN AEROBIC ONLY 10CC  Final   Culture NO GROWTH 5 DAYS  Final   Report Status 04/27/2015 FINAL  Final  Urine culture     Status: None   Collection Time: 04/22/15 10:15 PM  Result Value Ref Range Status   Specimen Description URINE, CATHETERIZED  Final   Special Requests NONE  Final   Culture 1,000 COLONIES/mL INSIGNIFICANT GROWTH  Final   Report Status 04/24/2015 FINAL  Final  MRSA PCR Screening     Status: None   Collection Time: 04/23/15  4:12 AM  Result Value Ref Range Status   MRSA by PCR NEGATIVE NEGATIVE Final    Comment:        The GeneXpert MRSA Assay (FDA approved for NASAL  specimens only), is one component of a comprehensive MRSA colonization surveillance program. It is not intended to diagnose MRSA infection nor to guide or monitor treatment for MRSA infections.   Clostridium Difficile by PCR (not at [facility])     Status: None  [facility] MedicineCollection Time: 05/01/15  5:10 PM  Result Value Ref Range Status   C difficile by pcr NEGATIVE NEGATIVE Final     Studies: No results found.  Scheduled Meds: . antiseptic oral rinse  7 mL Mouth Rinse q12n4p  . chlorhexidine  15 mL Mouth Rinse BID  . enoxaparin (LOVENOX) injection  30 mg Subcutaneous Q24H  . feeding supplement (ENSURE ENLIVE)  237 mL Oral TID BM  . folic acid  1 mg Intravenous Daily  . insulin aspart  0-9 Units Subcutaneous 6 times per day  . latanoprost  1 drop Both Eyes QHS  . potassium chloride  40 mEq Oral Once  . thiamine  100 mg Intravenous Daily   Continuous Infusions: . dextrose 5 % and 0.45% NaCl 50 mL/hr (05/02/15 0816)    Principal Problem:   Altered mental status Active Problems:   Volume depletion   Failure to thrive in adult   Blind   Glaucoma   Pyuria   Syncope and collapse   Alcohol abuse   Acute cystitis with hematuria   Fractured rib   Hypokalemia   Pressure ulcer   UTI (lower urinary tract infection)    Hypomagnesemia   Acute encephalopathy   Urinary tract infectious disease   Rib fracture   Encephalopathy acute   Hyperkalemia   Protein-calorie malnutrition, severe    Time spent: 35 minutes    THOMPSON,DANIEL M.D. [facility] Pager [phone]. If 7PM-7AM, please contact night-coverage at [email or web address], password [facility] 05/02/2015, 4:05 PM  LOS: 9 days

## 2015-05-02 NOTE — Evaluation (Signed)
Occupational Therapy Evaluation Patient Details Name: Greg Scott MRN: [id number] DOB: Nov 16, 1922 Today's Date: 05/02/2015    History of Present Illness Patient is an 81 yo male admitted 04/22/15 after being found on floor.  Patient with AMS, UTI, BLE weakness, sacral decubitus.  PMH:  glaucoma/blindness, ETOH abuse   Clinical Impression   Pt unable to communicate PLOF and no family available. Presents with impaired cognition, generalized weakness, poor balance and low vision.  He required +2 assist for bed mobility and moderate assist to sit EOB x 5 minutes. He is dependent in all ADL.  Pt will need SNF.  Will defer OT to SNF.    Follow Up Recommendations  SNF;Supervision/Assistance - 24 hour    Equipment Recommendations       Recommendations for Other Services       Precautions / Restrictions Precautions Precautions: Fall Restrictions Weight Bearing Restrictions: No Other Position/Activity Restrictions: Lt foot painful to touch      Mobility Bed Mobility Overal bed mobility: Needs Assistance;+2 for physical assistance Bed Mobility: Supine to Sit;Sit to Supine     Supine to sit: Max assist;+2 for physical assistance Sit to supine: Total assist;+2 for physical assistance   General bed mobility comments: Verbal and tactile cues for mobility.  Patient able to initiate movement of LE's off of bed.  Required +2 max assist to move to sitting.  Once upright, required mod assist to maintain balance, leaning posteriorly and to right.  Patient sat EOB x 5 minutes.  Returned to supine with +2 total assist.  Transfers                 General transfer comment: pt unsafe to transfer    Balance Overall balance assessment: Needs assistance Sitting-balance support: Bilateral upper extremity supported;Feet supported Sitting balance-[name]: Poor Sitting balance - Comments: Requires mod assist to maintain upright position and balance.  Postural control: Right lateral  lean;Posterior lean                                  ADL                                         General ADL Comments: total dependence in all ADL     Vision     Perception     Praxis      Pertinent Vitals/Pain Pain Assessment: Faces Pain Score: 4  Faces Pain Scale: Hurts little more Pain Location: L foot with touch Pain Descriptors / Indicators: Grimacing;Guarding Pain Intervention(s): Limited activity within patient's tolerance;Monitored during session;Repositioned     Hand Dominance     Extremity/Trunk Assessment Upper Extremity Assessment Upper Extremity Assessment: Generalized weakness   Lower Extremity Assessment Lower Extremity Assessment: Defer to PT evaluation LLE Deficits / Details: Decreased functional movement due to pain.  Discoloration noted ankle/foot LLE: Unable to fully assess due to pain       Communication Communication Communication: Expressive difficulties (pt with tangential speech, difficult to understand at times)   Cognition Arousal/Alertness: Awake/alert Behavior During Therapy: Restless Overall Cognitive Status: No family/caregiver present to determine baseline cognitive functioning                     General Comments       Exercises       Shoulder Instructions  Home Living Family/patient expects to be discharged to:: Skilled nursing facility Living Arrangements: Children                               Additional Comments: son? pt unable to provide PLOF      Prior Functioning/Environment Level of Independence: Needs assistance        Comments: Patient unable to provide PLOF    OT Diagnosis: Generalized weakness;Cognitive deficits;Acute pain;Blindness and low vision   OT Problem List:     OT Treatment/Interventions:      OT Goals(Current goals can be found in the care plan section) Acute Rehab OT Goals Patient Stated Goal: None stated  OT Frequency:      Barriers to D/C:            Co-evaluation PT/OT/SLP Co-Evaluation/Treatment: Yes Reason for Co-Treatment: Necessary to address cognition/behavior during functional activity;For patient/therapist safety PT goals addressed during session: Mobility/safety with mobility OT goals addressed during session: ADL's and self-care      End of Session    Activity Tolerance: Patient limited by pain Patient left: in bed;with call bell/phone within reach;with bed alarm set;with restraints reapplied (mitts)   Time: 1140-1156 OT Time Calculation (min): 16 min Charges:  OT General Charges $OT Visit: 1 Procedure OT Evaluation $Initial OT Evaluation Tier I: 1 Procedure G-Codes:    Greg Scott 05/02/2015, 2:54 PM [phone]

## 2015-05-02 NOTE — Evaluation (Addendum)
Physical Therapy Evaluation Patient Details Name: Greg StallsFred A Scott MRN: [id number] DOB: November 25, 1922 Today's Date: 05/02/2015   History of Present Illness  Patient is an 81 yo male admitted 04/22/15 after being found on floor.  Patient with AMS, UTI, BLE weakness, sacral decubitus.  PMH:  glaucoma/blindness, ETOH abuse  Clinical Impression  Patient presents with problems listed below.  Will benefit from acute PT to maximize functional mobility prior to discharge.  Patient currently requiring +2 assist for mobility.  Recommend SNF at discharge for continued therapy.    Follow Up Recommendations SNF;Supervision/Assistance - 24 hour    Equipment Recommendations  Other (comment) (TBD)    Recommendations for Other Services       Precautions / Restrictions Precautions Precautions: Fall Restrictions Weight Bearing Restrictions: No Other Position/Activity Restrictions: Lt foot painful to touch      Mobility  Bed Mobility Overal bed mobility: Needs Assistance;+2 for physical assistance Bed Mobility: Supine to Sit;Sit to Supine     Supine to sit: Max assist;+2 for physical assistance Sit to supine: Total assist;+2 for physical assistance   General bed mobility comments: Verbal and tactile cues for mobility.  Patient able to initiate movement of LE's off of bed.  Required +2 max assist to move to sitting.  Once upright, required mod assist to maintain balance, leaning posteriorly and to right.  Patient sat EOB x 5 minutes.  Returned to supine with +2 total assist.  Transfers                    Ambulation/Gait                Stairs            Wheelchair Mobility    Modified Rankin (Stroke Patients Only)       Balance Overall balance assessment: Needs assistance Sitting-balance support: Bilateral upper extremity supported;Feet supported Sitting balance-[name]: Poor Sitting balance - Comments: Requires mod assist to maintain upright position and balance.   Postural control: Posterior lean;Right lateral lean                                   Pertinent Vitals/Pain Pain Assessment: Faces Faces Pain Scale: Hurts little more Pain Location: Lt foot when touched.  Buttocks in sitting Pain Descriptors / Indicators: Grimacing Pain Intervention(s): Limited activity within patient's tolerance;Repositioned    Home Living Family/patient expects to be discharged to:: Skilled nursing facility                 Additional Comments: Patient unable to provide information on living situation    Prior Function Level of Independence: Needs assistance         Comments: Patient unable to provide PLOF     Hand Dominance        Extremity/Trunk Assessment   Upper Extremity Assessment: Defer to OT evaluation           Lower Extremity Assessment: Generalized weakness;LLE deficits/detail   LLE Deficits / Details: Decreased functional movement due to pain.  Discoloration noted ankle/foot     Communication   Communication: Expressive difficulties (Difficult to understand at times)  Cognition Arousal/Alertness: Awake/alert Behavior During Therapy: Restless Overall Cognitive Status: No family/caregiver present to determine baseline cognitive functioning                      General Comments      Exercises  Assessment/Plan    PT Assessment Patient needs continued PT services  PT Diagnosis Difficulty walking;Generalized weakness;Acute pain;Altered mental status   PT Problem List Decreased strength;Decreased activity tolerance;Decreased balance;Decreased mobility;Decreased cognition;Decreased knowledge of use of DME;Decreased skin integrity;Pain  PT Treatment Interventions DME instruction;Gait training;Functional mobility training;Therapeutic activities;Balance training;Cognitive remediation;Patient/family education   PT Goals (Current goals can be found in the Care Plan section) Acute Rehab PT  Goals Patient Stated Goal: None stated PT Goal Formulation: Patient unable to participate in goal setting Time For Goal Achievement: 05/16/15 Potential to Achieve Goals: Fair    Frequency Min 2X/week   Barriers to discharge        Co-evaluation PT/OT/SLP Co-Evaluation/Treatment: Yes Reason for Co-Treatment: For patient/therapist safety PT goals addressed during session: Mobility/safety with mobility         End of Session   Activity Tolerance: Patient limited by pain Patient left: in bed;with call bell/phone within reach;with bed alarm set           Time: 1140-1156 PT Time Calculation (min) (ACUTE ONLY): 16 min   Charges:   PT Evaluation $Initial PT Evaluation Tier I: 1 Procedure     PT G CodesVena Austria 2015-05-26, 1:05 PM

## 2015-05-02 NOTE — Clinical Social Work Note (Signed)
Clinical Social Work Assessment  Patient Details  Name: Greg Scott MRN: [id number] Date of Birth: 04-06-1923  Date of referral:  05/01/15               Reason for consuNolon Stallslt:  Facility Placement                Permission sought to share information with:  Other (Patient only oriented to self. CSW made contact with son.) Permission granted to share information::  No  Name::     Greg CopaFred Scott  Agency::     Relationship::  Son  Contact Information:  [phone]  Housing/Transportation Living arrangements for the past 2 months:  Single Family Home Source of Information:  Adult Children (Son Greg CopaFred Scott) Patient Interpreter Needed:  None Criminal Activity/Legal Involvement Pertinent to Current Situation/Hospitalization:  No - Comment as needed Significant Relationships:  Adult Children Lives with:    Do you feel safe going back to the place where you live?  No (Son does not feel that the patient will be safe going home.) Need for family participation in patient care:  Yes (Comment)  Care giving concerns:  Son concerned about patient going back home as he lives alone.   Social Worker assessment / plan:  CSW talked by phone with patient's son, Greg CopaFred Rocks regarding recommendation of ST rehab for patient. Mr. Greg Scott in agreement and this was discussed and his questions answered. Son also interested in long-term care for patient at a skilled facility or Assisted Living facility. This was discussed and Mr. Greg Scott informed that the skilled facility and ALF lists will be emailed to him and SNF list will also be left in patient's room per his request. Mr. Greg Scott advised that he will be notified when patient ready for discharge and that CSW will follow-up with him on Friday, 7/15 with facility responses.   Employment status:  Retired Database administratornsurance information:  Managed Medicare PT Recommendations:  Skilled Nursing Facility Information / Referral to community resources:  Other (Comment Required) (None needed or  requested at this time.)  Patient/Family's Response to care: Son did not express any concerns regarding patient's care during hospitalization.  Patient/Family's Understanding of and Emotional Response to Diagnosis, Current Treatment, and Prognosis:  Not discussed.  Emotional Assessment Appearance:  Appears stated age Attitude/Demeanor/Rapport:  Unable to Assess Affect (typically observed):  Unable to Assess Orientation:  Oriented to Self Alcohol / Substance use:  Tobacco Use, Alcohol Use (No drug or alcohol use history on file.) Psych involvement (Current and /or in the community):  No (Comment)  Discharge Needs  Concerns to be addressed:  Discharge Planning Concerns Readmission within the last 30 days:    Current discharge risk:  None Barriers to Discharge:  No Barriers Identified   Greg GoldmannCrawford, Greg Shad Bradley, LCSW 05/02/2015, 5:04 PM

## 2015-05-02 NOTE — Care Management Important Message (Signed)
Important Message  Patient Details  Name: Greg Scott MRN: [id number] Date of Birth: [birth year]   Medicare Important Message Given:  Yes-fourth notification given    Orson AloeMegan P Jairon Ripberger 05/02/2015, 1:56 PM

## 2015-05-02 NOTE — Consult Note (Signed)
WOC wound consult note Reason for Consult: sacrum Pt with AMS, from home, found down at home.  He is confused today and not able to answer questions appropriately.   Wound type: Sacrum appears intact, he is incontinent of bowel and bladder Some linear excoriation on the right upper buttock but they appear healed Two small areas on the right lower buttocks and one on the left area buttock, most likely related to MASD (moisture associated skin damage) Left hip small non blanchable area, Stage I Pressure Ulcer POA: Yes Measurement: Left hip: 1x 1.5cm x0 Right buttock: 1.0cm x 1.0cm x 0.1cm and 0.5cm x0.5cm x 0.1 Left buttock 0.3cm x 0.3cm x 0.1cm Wound bed: areas on the buttocks area clean, pink and moist. The left hip skin is not broken at this point. Drainage (amount, consistency, odor) none Periwound: intact  Dressing procedure/placement/frequency: Turn patient from side to side and use moisture barrier cream over the buttocks to protect from further injury.  Reapply after each episode of incontinence.  Silicone foam to the left hip.   Discussed POC with patient and bedside nurse.  Re consult if needed, will not follow at this time. Thanks  Chidera Dearcos [employer] RN, CWOCN [phone])

## 2015-05-02 NOTE — Progress Notes (Signed)
Nutrition Follow-up  DOCUMENTATION CODES:   Severe malnutrition in context of chronic illness  INTERVENTION:   Provide Ensure Enlive po TID (thickened to honey thick), each supplement provides 350 kcal and 20 grams of protein.  Provide Magic cup TID with meals, each supplement provides 290 kcal and 9 grams of protein.  Encourage adequate PO intake.   NUTRITION DIAGNOSIS:   Inadequate oral intake related to dysphagia as evidenced by meal completion < 25%; onging  GOAL:   Patient will meet greater than or equal to 90% of their needs; not met  MONITOR:   PO intake, Supplement acceptance, Labs, Weight trends  REASON FOR ASSESSMENT:   Consult Enteral/tube feeding initiation and management  ASSESSMENT:   81 y.o. Male with hx glaucoma/ blindness, lives at home with family. Has been doing more poorly over the last few months in terms of mobility. Pt's PCP thought that he might have a "pinched nerve" in his back causing bilat LE weakness and frequent falls that the pt was having. In the midst of these issues, the son went away for the weekend and a caretaker was supposed to look after Mr Garciamartinez. On returning they found the pt on the floor with dried feces/ urine, poorly responsive to brought to ED.   Meal completion has been 0-10%. Pt was a bit confused during time of visit. Unable obtain nutrition hx. Pt is however agreeable to Ensure to aid in caloric and protein needs. RD to order. Pt was encouraged to eat his food at meals and to drink his supplements.  Nutrition-Focused physical exam completed. Findings are severe fat depletion, severe muscle depletion, and moderate edema.   Labs and medications reviewed.  Diet Order:  DIET - DYS 1 Room service appropriate?: Yes; Fluid consistency:: Honey Thick  Skin:  Wound (see comment) (stage 1 to sacrum, DTI to foot) Stage II pressure ulcer on buttocks, Stage I on L hip  Last BM:  7/13  Height:   Ht Readings from Last 1 Encounters:   04/23/15 5' 11"  (1.803 m)    Weight:   Wt Readings from Last 1 Encounters:  05/02/15 139 lb 8.8 oz (63.3 kg)    Ideal Body Weight:  78.1 kg  Wt Readings from Last 10 Encounters:  05/02/15 139 lb 8.8 oz (63.3 kg)    BMI:  Body mass index is 19.47 kg/(m^2).  Estimated Nutritional Needs:   Kcal:  1600-1800  Protein:  80-90 gm  Fluid:  1.6-1.8 L  EDUCATION NEEDS:   Education needs no appropriate at this time  Corrin Parker, MS, RD, LDN Pager # [phone] After hours/ weekend pager # [phone]

## 2015-05-03 LAB — CBC
HCT: 23.4 % — ABNORMAL LOW (ref 39.0–52.0)
Hemoglobin: 7.9 g/dL — ABNORMAL LOW (ref 13.0–17.0)
MCH: 32.1 pg (ref 26.0–34.0)
MCHC: 33.8 g/dL (ref 30.0–36.0)
MCV: 95.1 fL (ref 78.0–100.0)
Platelets: 288 10*3/uL (ref 150–400)
RBC: 2.46 MIL/uL — ABNORMAL LOW (ref 4.22–5.81)
RDW: 13.8 % (ref 11.5–15.5)
WBC: 7 10*3/uL (ref 4.0–10.5)

## 2015-05-03 LAB — BASIC METABOLIC PANEL
Anion gap: 7 (ref 5–15)
CALCIUM: 7.5 mg/dL — AB (ref 8.9–10.3)
CHLORIDE: 110 mmol/L (ref 101–111)
CO2: 22 mmol/L (ref 22–32)
Creatinine, Ser: 1.08 mg/dL (ref 0.61–1.24)
GFR, EST NON AFRICAN AMERICAN: 59 mL/min — AB (ref >60)
Glucose, Bld: 93 mg/dL (ref 65–99)
POTASSIUM: 3.4 mmol/L — AB (ref 3.5–5.1)
Sodium: 139 mmol/L (ref 135–145)

## 2015-05-03 LAB — GLUCOSE, CAPILLARY
Glucose-Capillary: 93 mg/dL (ref 65–99)
Glucose-Capillary: 88 mg/dL (ref 65–99)
Glucose-Capillary: 93 mg/dL (ref 65–99)
Glucose-Capillary: 86 mg/dL (ref 65–99)
Glucose-Capillary: 93 mg/dL (ref 65–99)
Glucose-Capillary: 84 mg/dL (ref 65–99)

## 2015-05-03 MED ORDER — POTASSIUM CHLORIDE CRYS ER 20 MEQ PO TBCR
40.0000 meq | EXTENDED_RELEASE_TABLET | Freq: Every day | ORAL | Status: DC
  Administered 2015-05-03: 40 meq via ORAL
  Filled 2015-05-03 (×3): qty 2

## 2015-05-03 NOTE — Progress Notes (Signed)
Speech Language Pathology Treatment: Dysphagia  Patient Details Name: Greg Scott MRN: [id number] DOB: 04/07/1923 Today's Date: 05/03/2015 Time: [id number] SLP Time Calculation (min) (ACUTE ONLY): 17 min  Assessment / Plan / Recommendation Clinical Impression  Pt provided max-total assist with feeding and swallow precautions with part of breakfast. Pt alert and agreeable to po's. He exhibits oral holding, prolonged manipulation and delayed transit (1-2 minutes) characteristic of pt's with advanced dementia thus increasing aspiration risk. Delayed throat clear x 1. Recommend continue Dys 1 texture and honey thick via teaspoon, sit upright, verbal and use of dry spoon to facilitate swallow, make sure pt has swallowed prior to subsequent bites/sips. Will follow up x 1.    HPI Other Pertinent Information: Greg Scott is a 81 y.o. BM PMHx glaucoma/ blindness, lives at home with family. Has been doing more poorly over the last few months in terms of mobility. Pt's PCP thought that he might have a "pinched nerve" in his back causing bilat LE weakness and frequent falls that the pt was having. In the midst of these issues, the son went away for the weekend and a caretaker was supposed to look after Mr Greg Scott. On returning they found the pt on the floor with dried feces/ urine, poorly responsive to brought to ED.    Pertinent Vitals Pain Assessment: No/denies pain  SLP Plan  Continue with current plan of care    Recommendations Diet recommendations: Dysphagia 1 (puree);Honey-thick liquid Liquids provided via: Teaspoon Medication Administration: Crushed with puree Supervision: Staff to assist with self feeding;Full supervision/cueing for compensatory strategies Compensations: Slow rate;Small sips/bites;Check for pocketing Postural Changes and/or Swallow Maneuvers: Seated upright 90 degrees              Oral Care Recommendations: Oral care BID Follow up Recommendations: Skilled Nursing facility Plan:  Continue with current plan of care    GO     Greg Scott, Greg Scott 05/03/2015, 8:45 AM  Breck CoonsLisa Scott Lonell Scott M.Ed [employer]-SLP Pager [phone]

## 2015-05-03 NOTE — Progress Notes (Signed)
[facility] PROGRESS NOTE  Greg Scott YQI:[id number] DOB: 1923/07/06 DOA: 04/22/2015 PCP: No primary care provider on file.  Admit HPI / Brief Narrative: Greg StallsFred A Scott is a 81 y.o. BM PMHx glaucoma/ blindness, lives at home with family. Has been doing more poorly over the last few months in terms of mobility. Pt's PCP thought that he might have a "pinched nerve" in his back causing bilat LE weakness and frequent falls that the pt was having. In the midst of these issues, the son went away for the weekend and a caretaker was supposed to look after Mr Greg Scott. On returning they found the pt on the floor with dried feces/ urine, poorly responsive to brought to ED.   In ED pt was hypothermic, BP 127/86, HR 88, temp 95.2. He looked dehydrated and has rec'd about 4L IVF's so far. Na 146 CO2 23 BUN 35 Creat 1.31. Alb 2.5, Ca 8.1, total bili 1.7, NH3 16. Lactic acid 2.1, WBC 8k, Hb 9.9, Plt 151 Trop 0.04 UA large small LE/ pos nitrite/ 3-6 wbc's CT head showed some small WM changes, no acute CVA, no skull fracture  Assessment/Plan: Syncope and collapse -Likely multifactorial to include UTI, alcohol abuse (possible seizure); no evidence of stroke or MI -Echocardiogram; see results below -Troponin 3 mildly positive -Brain MRI/MRA negative for acute infarct or masses. -TSH within normal limit -Continue Sensitive SSI -EEG; normal  Alcohol abuse -Alcohol level negative - perhaps patient's presentation was due to acute alcohol withdrawal -urine drug screen negative  -Discontinued CIWA protocol, and all narcotics/sedating medication -Passed swallow study continue to D5W- 0.45% saline @50  ml/hr, until we are able to obtain that he can keep up with nutrition/hydration demand.  Acute Encephalopathy/AMS -See syncope and fall -Discontinued all benzodiazepine and narcotics - Patient improving. Patient with some bouts of confusion and some hallucination. Patient may have a component of dementia.  Monitor for now. If hallucinations worsens may need a psych evaluation. -Swallow study; passed; dysphagia 1 honey thick  Urinary tract infection with hematuria -Completed 5 day course antibiotics  Rib fracture -Remote right fifth and sixth rib fractures; monitor for now  Hypokalemia/Hyperkalemia -Potassium goal> 4 -Kayexalate 30 gm 1 for hyperkalemia.  Hypomagnesemia  -Replete. Keep magnesium goal > 2   Stage II sacral decubitus Continue frequent turns.  Prophylaxis Heparin for DVT prophylaxis.     Code Status: Full Family Communication: Updated patient. No family at bedside. Disposition Plan: SNF    Consultants:  None  Procedures: 7/4 CT head without contrast;No acute intracranial pathology -Moderate cortical volume loss and scattered small vessel ischemic microangiopathy. 7/5 MRI brain without contrast; -moderate atrophy. -Negative for acute infarct or mass.  7/6 echocardiogram;- LVEF= 55%- 60%. Hypokinesis of the mid-apicalanteroseptal myocardium. 7/8 EEG; normal  Culture 7/4 blood left arm/hand NGTD 7/4 urine negative 7/5 MRSA by PCR negative   Antibiotics:  IV Zosyn 04/22/15 >>> 04/24/15  IV vancomycin 04/22/2015>>>>> 04/24/2015  IV Rocephin 04/24/2015>>>>>> 04/28/2015  HPI/Subjective: Patient alert and following some commands. Patient with confusion. Patient with some hallucinations. Patient  Objective: Filed Vitals:   05/03/15 0814  BP: 139/67  Pulse: 88  Temp: 97.9 F (36.6 C)  Resp: 16    Intake/Output Summary (Last 24 hours) at 05/03/15 1343 Last data filed at 05/03/15 0900  Gross per 24 hour  Intake    120 ml  Output    425 ml  Net   -305 ml   Filed Weights   04/22/15 2114 04/23/15 0330 05/02/15 0322  Weight: 54.432 kg (120 lb) 56.4 kg (124 lb 5.4 oz) 63.3 kg (139 lb 8.8 oz)    Exam:   General:  NAD. Mittens on  Cardiovascular: RRR  Respiratory: CTAB  Abdomen: Soft/NT/ND/+BS  Musculoskeletal: No c/c/e  Data  Reviewed: Basic Metabolic Panel:  Recent Labs Lab 04/28/15 0235 04/29/15 0218 04/30/15 0235 05/01/15 0930 05/02/15 0612 05/02/15 1525 05/03/15 0359  NA 144 145 143 142 141  --  139  K 5.2* 4.0 3.9 3.3* 2.9* 3.3* 3.4*  CL 114* 115* 115* 114* 109  --  110  CO2 21* 25 21* 23 24  --  22  GLUCOSE 125* 132* 95 109* 94  --  93  BUN <5* <5* 6 5* <5*  --  <5*  CREATININE 0.95 1.01 1.20 0.98 1.06  --  1.08  CALCIUM 8.3* 8.1* 7.8* 7.8* 8.0*  --  7.5*  MG 1.7 1.5* 1.9 1.8 1.7  --   --    Liver Function Tests:  Recent Labs Lab 04/27/15 0232 04/28/15 0235 04/29/15 0218 04/30/15 0235  AST 35 36 34 35  ALT ALKPHOS 56 60 59 55  BILITOT 0.5 0.7 0.8 0.7  PROT 4.7* 5.0* 4.9* 4.7*  ALBUMIN 2.0* 2.0* 1.9* 1.8*   No results for input(s): LIPASE, AMYLASE in the last 168 hours. No results for input(s): AMMONIA in the last 168 hours. CBC:  Recent Labs Lab 04/27/15 0232 04/28/15 0235 04/29/15 0218 05/01/15 0930 05/02/15 0612 05/03/15 0359  WBC 7.0 6.8 7.0 7.0 8.1 7.0  NEUTROABS 5.8 5.5 5.6  --   --   --   HGB 9.4* 10.0* 9.3* 8.5* 9.1* 7.9*  HCT 28.4* 29.8* 27.3* 25.4* 27.2* 23.4*  MCV 96.9 98.7 98.9 96.2 96.1 95.1  PLT 177 185 199 252 300 288   Cardiac Enzymes: No results for input(s): CKTOTAL, CKMB, CKMBINDEX, TROPONINI in the last 168 hours. BNP (last 3 results) No results for input(s): BNP in the last 8760 hours.  ProBNP (last 3 results) No results for input(s): PROBNP in the last 8760 hours.  CBG:  Recent Labs Lab 05/02/15 2057 05/02/15 2320 05/03/15 0349 05/03/15 0814 05/03/15 1204  GLUCAP 93 88 93 86 93    Recent Results (from the past 240 hour(s))  Clostridium Difficile by PCR (not at [facility])     Status: None   Collection Time: 05/01/15  5:10 PM  Result Value Ref Range Status   C difficile by pcr NEGATIVE NEGATIVE Final     Studies: No results found.  Scheduled Meds: . antiseptic oral rinse  7 mL Mouth Rinse q12n4p  . chlorhexidine  15 mL  Mouth Rinse BID  . enoxaparin (LOVENOX) injection  30 mg Subcutaneous Q24H  . feeding supplement (ENSURE ENLIVE)  237 mL Oral TID BM  . folic acid  1 mg Intravenous Daily  . insulin aspart  0-9 Units Subcutaneous 6 times per day  . latanoprost  1 drop Both Eyes QHS  . potassium chloride  40 mEq Oral Daily  . thiamine  100 mg Intravenous Daily   Continuous Infusions: . dextrose 5 % and 0.45% NaCl 50 mL/hr at 05/03/15 0024    Principal Problem:   Altered mental status Active Problems:   Volume depletion   Failure to thrive in adult   Blind   Glaucoma   Pyuria   Syncope and collapse   Alcohol abuse   Acute cystitis with hematuria   Fractured rib   Hypokalemia  Pressure ulcer   UTI (lower urinary tract infection)   Hypomagnesemia   Acute encephalopathy   Urinary tract infectious disease   Rib fracture   Encephalopathy acute   Hyperkalemia   Protein-calorie malnutrition, severe    Time spent: 35 minutes    Delyle Weider M.D. [facility] Pager [phone]. If 7PM-7AM, please contact night-coverage at [email or web address], password [facility] 05/03/2015, 1:43 PM  LOS: 10 days

## 2015-05-03 NOTE — Clinical Social Work Placement (Signed)
   CLINICAL SOCIAL WORK PLACEMENT  NOTE  Date:  05/03/2015  Patient Details  Name: [name] A Sanguinetti MRN: [id number] Date of Birth: September 19, 1923  Clinical Social Work is seeking post-discharge placement for this patient at the Skilled  Nursing Facility level of care (*CSW will initial, date and re-position this form in  chart as items are completed):  Yes   Patient/family provided with [place] Clinical Social Work Department's list of facilities offering this level of care within the geographic area requested by the patient (or if unable, by the patient's family).  Yes   Patient/family informed of their freedom to choose among providers that offer the needed level of care, that participate in Medicare, Medicaid or managed care program needed by the patient, have an available bed and are willing to accept the patient.  Yes   Patient/family informed of [facility]'s ownership interest in [facility] and [facility], as well as of the fact that they are under no obligation to receive care at these facilities.  PASRR submitted to EDS on 05/01/15     PASRR number received on 05/01/15     Existing PASRR number confirmed on       FL2 transmitted to all facilities in geographic area requested by pt/family on 05/02/15     FL2 transmitted to all facilities within larger geographic area on       Patient informed that his/her managed care company has contracts with or will negotiate with certain facilities, including the following:            Patient/family informed of bed offers received.  Patient chooses bed at       Physician recommends and patient chooses bed at      Patient to be transferred to   on  .  Patient to be transferred to facility by       Patient family notified on   of transfer.  Name of family member notified:        PHYSICIAN Please sign FL2     Additional Comment:  05/03/15 - Family contact is son - Cammy CopaFred Donati  [phone]).  _______________________________________________ Cristobal Goldmannrawford, Berry Godsey Bradley, LCSW 05/03/2015, 6:54 PM

## 2015-05-04 DIAGNOSIS — E43 Unspecified severe protein-calorie malnutrition: Secondary | ICD-10-CM

## 2015-05-04 DIAGNOSIS — R441 Visual hallucinations: Secondary | ICD-10-CM

## 2015-05-04 DIAGNOSIS — F0391 Unspecified dementia with behavioral disturbance: Secondary | ICD-10-CM

## 2015-05-04 DIAGNOSIS — R404 Transient alteration of awareness: Secondary | ICD-10-CM

## 2015-05-04 LAB — CBC
HCT: 22.2 % — ABNORMAL LOW (ref 39.0–52.0)
HEMOGLOBIN: 7.6 g/dL — AB (ref 13.0–17.0)
MCH: 33.2 pg (ref 26.0–34.0)
MCHC: 34.2 g/dL (ref 30.0–36.0)
MCV: 96.9 fL (ref 78.0–100.0)
Platelets: 304 10*3/uL (ref 150–400)
RBC: 2.29 MIL/uL — AB (ref 4.22–5.81)
RDW: 14.2 % (ref 11.5–15.5)
WBC: 8.2 10*3/uL (ref 4.0–10.5)

## 2015-05-04 LAB — BASIC METABOLIC PANEL
Anion gap: 6 (ref 5–15)
BUN: <5 mg/dL — ABNORMAL LOW (ref 6–20)
CALCIUM: 7.5 mg/dL — AB (ref 8.9–10.3)
CO2: 24 mmol/L (ref 22–32)
CREATININE: 1.13 mg/dL (ref 0.61–1.24)
Chloride: 111 mmol/L (ref 101–111)
GFR calc Af Amer: >60 mL/min (ref >60)
GFR calc non Af Amer: 56 mL/min — ABNORMAL LOW (ref >60)
GLUCOSE: 109 mg/dL — AB (ref 65–99)
Potassium: 3.5 mmol/L (ref 3.5–5.1)
SODIUM: 141 mmol/L (ref 135–145)

## 2015-05-04 LAB — IRON AND TIBC: IRON: 21 ug/dL — AB (ref 45–182)

## 2015-05-04 LAB — GLUCOSE, CAPILLARY
GLUCOSE-CAPILLARY: 71 mg/dL (ref 65–99)
GLUCOSE-CAPILLARY: 73 mg/dL (ref 65–99)
GLUCOSE-CAPILLARY: 75 mg/dL (ref 65–99)
Glucose-Capillary: 147 mg/dL — ABNORMAL HIGH (ref 65–99)
Glucose-Capillary: 52 mg/dL — ABNORMAL LOW (ref 65–99)
Glucose-Capillary: 86 mg/dL (ref 65–99)
Glucose-Capillary: 98 mg/dL (ref 65–99)
Glucose-Capillary: 73 mg/dL (ref 65–99)

## 2015-05-04 LAB — RETICULOCYTES
RBC.: 2.4 MIL/uL — ABNORMAL LOW (ref 4.22–5.81)
Retic Count, Absolute: 31.2 10*3/uL (ref 19.0–186.0)
Retic Ct Pct: 1.3 % (ref 0.4–3.1)

## 2015-05-04 LAB — FERRITIN: FERRITIN: 691 ng/mL — AB (ref 24–336)

## 2015-05-04 LAB — FOLATE: Folate: 16.1 ng/mL (ref >5.9)

## 2015-05-04 LAB — VITAMIN B12: Vitamin B-12: 397 pg/mL (ref 180–914)

## 2015-05-04 MED ORDER — DEXTROSE 50 % IV SOLN
INTRAVENOUS | Status: AC
Start: 2015-05-04 — End: 2015-05-04
  Administered 2015-05-04: 25 mL
  Filled 2015-05-04: qty 50

## 2015-05-04 MED ORDER — QUETIAPINE FUMARATE 25 MG PO TABS
25.0000 mg | ORAL_TABLET | Freq: Every day | ORAL | Status: DC
  Administered 2015-05-04: 25 mg via ORAL
  Filled 2015-05-04 (×3): qty 1

## 2015-05-04 NOTE — Progress Notes (Signed)
[facility] PROGRESS NOTE  Greg Scott WGN:[id number] DOB: [birth year] DOA: 04/22/2015 PCP: No primary care provider on file.  Admit HPI / Brief Narrative: Greg Scott is a 81 y.o. BM PMHx glaucoma/ blindness, lives at home with family. Has been doing more poorly over the last few months in terms of mobility. Pt's PCP thought that he might have a "pinched nerve" in his back causing bilat LE weakness and frequent falls that the pt was having. In the midst of these issues, the son went away for the weekend and a caretaker was supposed to look after Mr B[place]rock. On returning they found the pt on the floor with dried feces/ urine, poorly responsive to brought to ED.   In ED pt was hypothermic, BP 127/86, HR 88, temp 95.2. He looked dehydrated and has rec'd about 4L IVF's so far. Na 146 CO2 23 BUN 35 Creat 1.31. Alb 2.5, Ca 8.1, total bili 1.7, NH3 16. Lactic acid 2.1, WBC 8k, Hb 9.9, Plt 151 Trop 0.04 UA large small LE/ pos nitrite/ 3-6 wbc's CT head showed some small WM changes, no acute CVA, no skull fracture  Assessment/Plan: Syncope and collapse -Likely multifactorial to include UTI, alcohol abuse (possible seizure); no evidence of stroke or MI -Echocardiogram; see results below -Troponin 3 mildly positive -Brain MRI/MRA negative for acute infarct or masses. -TSH within normal limit -Continue Sensitive SSI -EEG; normal  Alcohol abuse -Alcohol level negative - perhaps patient's presentation was due to acute alcohol withdrawal -urine drug screen negative  -Discontinued CIWA protocol, and all narcotics/sedating medication -Passed swallow study. Saline lock IV fluids.   Acute Encephalopathy/AMS -See syncope and fall. Patient also with some hallucinations. -Discontinued all benzodiazepine and narcotics - Patient improving slowly. Patient with some bouts of confusion and some hallucination. Patient may have a component of dementia. Monitor for now. Consult with psychiatry for  further evaluation and management. -Swallow study; passed; dysphagia 1 honey thick  Urinary tract infection with hematuria -Completed 5 day course antibiotics  Anemia May be dilutional in nature. Patient with no overt bleeding. Check an anemia panel. Check FOBT. Follow H&H. Transfusion threshold hemoglobin less than 7.  Rib fracture -Remote right fifth and sixth rib fractures; monitor for now  Hypokalemia/Hyperkalemia -Potassium goal> 4 -Kayexalate 30 gm 1 for hyperkalemia.  Hypomagnesemia  -Replete. Keep magnesium goal > 2   Stage II sacral decubitus Continue frequent turns.  Prophylaxis Heparin for DVT prophylaxis.     Code Status: Full Family Communication: Updated patient. No family at bedside. Disposition Plan: SNF    Consultants:  Psychiatry pending  Procedures: 7/4 CT head without contrast;No acute intracranial pathology -Moderate cortical volume loss and scattered small vessel ischemic microangiopathy. 7/5 MRI brain without contrast; -moderate atrophy. -Negative for acute infarct or mass.  7/6 echocardiogram;- LVEF= 55%- 60%. Hypokinesis of the mid-apicalanteroseptal myocardium. 7/8 EEG; normal  Culture 7/4 blood left arm/hand NGTD 7/4 urine negative 7/5 MRSA by PCR negative   Antibiotics:  IV Zosyn 04/22/15 >>> 04/24/15  IV vancomycin 04/22/2015>>>>> 04/24/2015  IV Rocephin 04/24/2015>>>>>> 04/28/2015  HPI/Subjective: Patient sleeping. Per nursing patient refused oral medication this morning.    Objective: Filed Vitals:   05/04/15 0916  BP: 120/30  Pulse: 94  Temp: 97.9 F (36.6 C)  Resp: 19    Intake/Output Summary (Last 24 hours) at 05/04/15 1102 Last data filed at 05/03/15 1700  Gross per 24 hour  Intake    120 ml  Output    350 ml  Net   -  230 ml   Filed Weights   04/23/15 0330 05/02/15 0322 05/03/15 2017  Weight: 56.4 kg (124 lb 5.4 oz) 63.3 kg (139 lb 8.8 oz) 65 kg (143 lb 4.8 oz)    Exam:   General:  NAD. Mittens  on  Cardiovascular: RRR  Respiratory: CTAB  Abdomen: Soft/NT/ND/+BS  Musculoskeletal: No c/c/e  Data Reviewed: Basic Metabolic Panel:  Recent Labs Lab 04/28/15 0235 04/29/15 0218 04/30/15 0235 05/01/15 0930 05/02/15 0612 05/02/15 1525 05/03/15 0359 05/04/15 0428  NA 144 145 143 142 141  --  139 141  K 5.2* 4.0 3.9 3.3* 2.9* 3.3* 3.4* 3.5  CL 114* 115* 115* 114* 109  --  110 111  CO2 21* 25 21* 23 24  --  22 24  GLUCOSE 125* 132* 95 109* 94  --  93 109*  BUN <5* <5* 6 5* <5*  --  <5* <5*  CREATININE 0.95 1.01 1.20 0.98 1.06  --  1.08 1.13  CALCIUM 8.3* 8.1* 7.8* 7.8* 8.0*  --  7.5* 7.5*  MG 1.7 1.5* 1.9 1.8 1.7  --   --   --    Liver Function Tests:  Recent Labs Lab 04/28/15 0235 04/29/15 0218 04/30/15 0235  AST 36 34 35  ALT 31 28 27   ALKPHOS 60 59 55  BILITOT 0.7 0.8 0.7  PROT 5.0* 4.9* 4.7*  ALBUMIN 2.0* 1.9* 1.8*   No results for input(s): LIPASE, AMYLASE in the last 168 hours. No results for input(s): AMMONIA in the last 168 hours. CBC:  Recent Labs Lab 04/28/15 0235 04/29/15 0218 05/01/15 0930 05/02/15 0612 05/03/15 0359 05/04/15 0428  WBC 6.8 7.0 7.0 8.1 7.0 8.2  NEUTROABS 5.5 5.6  --   --   --   --   HGB 10.0* 9.3* 8.5* 9.1* 7.9* 7.6*  HCT 29.8* 27.3* 25.4* 27.2* 23.4* 22.2*  MCV 98.7 98.9 96.2 96.1 95.1 96.9  PLT 185 199 252 300 288 304   Cardiac Enzymes: No results for input(s): CKTOTAL, CKMB, CKMBINDEX, TROPONINI in the last 168 hours. BNP (last 3 results) No results for input(s): BNP in the last 8760 hours.  ProBNP (last 3 results) No results for input(s): PROBNP in the last 8760 hours.  CBG:  Recent Labs Lab 05/03/15 1958 05/04/15 0005 05/04/15 0403 05/04/15 0436 05/04/15 0740  GLUCAP 147* 73 52* 71 86    Recent Results (from the past 240 hour(s))  Clostridium Difficile by PCR (not at [facility])     Status: None   Collection Time: 05/01/15  5:10 PM  Result Value Ref Range Status   C difficile by pcr NEGATIVE NEGATIVE  Final     Studies: No results found.  Scheduled Meds: . antiseptic oral rinse  7 mL Mouth Rinse q12n4p  . chlorhexidine  15 mL Mouth Rinse BID  . enoxaparin (LOVENOX) injection  30 mg Subcutaneous Q24H  . feeding supplement (ENSURE ENLIVE)  237 mL Oral TID BM  . folic acid  1 mg Intravenous Daily  . insulin aspart  0-9 Units Subcutaneous 6 times per day  . latanoprost  1 drop Both Eyes QHS  . potassium chloride  40 mEq Oral Daily  . thiamine  100 mg Intravenous Daily   Continuous Infusions:    Principal Problem:   Altered mental status Active Problems:   Volume depletion   Failure to thrive in adult   Blind   Glaucoma   Pyuria   Syncope and collapse   Alcohol abuse   Acute  cystitis with hematuria   Fractured rib   Hypokalemia   Pressure ulcer   UTI (lower urinary tract infection)   Hypomagnesemia   Acute encephalopathy   Urinary tract infectious disease   Rib fracture   Encephalopathy acute   Hyperkalemia   Protein-calorie malnutrition, severe    Time spent: 35 minutes    Aldean Pipe M.D. [facility] Pager [phone]. If 7PM-7AM, please contact night-coverage at [email or web address], password [email or web address] 05/04/2015, 11:02 AM  LOS: 11 days

## 2015-05-04 NOTE — Consult Note (Signed)
[facility] Consult   Reason for Consult:  Confusion and alcohol abuse Referring Physician:  Dr. Grandville Silos Patient Identification: Greg Scott MRN:  [id number] Principal Diagnosis: Altered mental status Diagnosis:   Patient Active Problem List   Diagnosis Date Noted  . Protein-calorie malnutrition, severe [E43] 05/02/2015  . Encephalopathy acute [G93.40]   . Hyperkalemia [E87.5]   . Acute encephalopathy [G93.40]   . Urinary tract infectious disease [N39.0]   . Rib fracture [S22.39XA]   . UTI (lower urinary tract infection) [N39.0]   . Hypomagnesemia [E83.42]   . Pressure ulcer [L89.90] 04/24/2015  . Volume depletion [E86.9] 04/23/2015  . Altered mental status [R41.82] 04/23/2015  . Failure to thrive in adult [R62.7] 04/23/2015  . Blind [H54.0] 04/23/2015  . Glaucoma [H40.9] 04/23/2015  . Pyuria [N39.0] 04/23/2015  . AKI (acute kidney injury) [N17.9]   . Hypothermia [T68.XXXA]   . Syncope and collapse [R55]   . Alcohol abuse [F10.10]   . Acute cystitis with hematuria [N30.01]   . Fractured rib [S22.39XA]   . Hypokalemia [E87.6]     Total Time spent with patient: 45 minutes  Subjective:   Greg Scott is a 81 y.o. male patient admitted with confusion.  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 Patient has history of drinking since he has been retired and has been slowly deteriorated his mental and physical condition. He needs 24 hours care and bed ridden at this time. Patient is poor historian, verbal responses are unpredictable and does not makes sense when he responds. Reportedly his son believes that he has been depressed over three months and base line is able to communicate appropriately and at times can feed himself. He has recent one  month history of syncopal episodes. He appeared lying down on his bed awake, not alert and oriented to place, person, time and situation. He does talk spontaneously and observed talking to himself Greg no one in his room. Patient has no history of substance abuse or psychiatric treatment as out patient or inpatient.   Medical history: PMHx glaucoma/ blindness, lives at home with family. Has been doing more poorly over the last few months in terms of mobility. Pt's PCP thought that he might have a "pinched nerve" in his back causing bilat LE weakness and frequent falls that the pt was having. In the midst of these issues, the son went away for the weekend and a caretaker was supposed to look after Greg Scott. On returning they found the pt on the floor with dried feces/ urine, poorly responsive to brought to ED.    HPI Elements:   Location:  AMS and hallucinations. Quality:  poor. Severity:  unable to care for himself. Timing:  slow deterioration. Duration:  one month. Context:  lack of visual stimulation, age, substance abuse and unknown stresses..  Past Medical History:  Past Medical History  Diagnosis Date  . Glaucoma   . Blind    History reviewed. No pertinent past surgical history. Family History: History reviewed. No pertinent family history. Social History:  History  Alcohol Use: Not on file     History  Drug Use Not on file    History   Social History  . Marital Status: Married    Spouse Name: N/A  . Number  of Children: N/A  . Years of Education: N/A   Social History Main Topics  . Smoking status: Never Smoker   . Smokeless tobacco: Not on file  . Alcohol Use: Not on file  . Drug Use: Not on file  . Sexual Activity: Not on file   Other Topics Concern  . None   Social History Narrative   Additional Social History:                          Allergies:  No Known Allergies  Labs:  Results for orders placed or performed during the hospital encounter of  04/22/15 (from the past 48 hour(s))  Potassium     Status: Abnormal   Collection Time: 05/02/15  3:25 PM  Result Value Ref Range   Potassium 3.3 (L) 3.5 - 5.1 mmol/L  Glucose, capillary     Status: None   Collection Time: 05/02/15  4:04 PM  Result Value Ref Range   Glucose-Capillary 74 65 - 99 mg/dL  Glucose, capillary     Status: None   Collection Time: 05/02/15  8:57 PM  Result Value Ref Range   Glucose-Capillary 93 65 - 99 mg/dL  Glucose, capillary     Status: None   Collection Time: 05/02/15 11:20 PM  Result Value Ref Range   Glucose-Capillary 88 65 - 99 mg/dL  Glucose, capillary     Status: None   Collection Time: 05/03/15  3:49 AM  Result Value Ref Range   Glucose-Capillary 93 65 - 99 mg/dL  CBC     Status: Abnormal   Collection Time: 05/03/15  3:59 AM  Result Value Ref Range   WBC 7.0 4.0 - 10.5 K/uL   RBC 2.46 (L) 4.22 - 5.81 MIL/uL   Hemoglobin 7.9 (L) 13.0 - 17.0 g/dL   HCT 23.4 (L) 39.0 - 52.0 %   MCV 95.1 78.0 - 100.0 fL   MCH 32.1 26.0 - 34.0 pg   MCHC 33.8 30.0 - 36.0 g/dL   RDW 13.8 11.5 - 15.5 %   Platelets 288 150 - 400 K/uL  Basic metabolic panel     Status: Abnormal   Collection Time: 05/03/15  3:59 AM  Result Value Ref Range   Sodium 139 135 - 145 mmol/L   Potassium 3.4 (L) 3.5 - 5.1 mmol/L   Chloride 110 101 - 111 mmol/L   CO2 22 22 - 32 mmol/L   Glucose, Bld 93 65 - 99 mg/dL   BUN <5 (L) 6 - 20 mg/dL   Creatinine, Ser 1.08 0.61 - 1.24 mg/dL   Calcium 7.5 (L) 8.9 - 10.3 mg/dL   GFR calc non Af Amer 59 (L) >60 mL/min   GFR calc Af Amer >60 >60 mL/min    Comment: (NOTE) The eGFR has been calculated using the CKD EPI equation. This calculation has not been validated in all clinical situations. eGFR's persistently <60 mL/min signify possible Chronic Kidney Disease.    Anion gap 7 5 - 15  Glucose, capillary     Status: None   Collection Time: 05/03/15  8:14 AM  Result Value Ref Range   Glucose-Capillary 86 65 - 99 mg/dL  Glucose, capillary      Status: None   Collection Time: 05/03/15 12:04 PM  Result Value Ref Range   Glucose-Capillary 93 65 - 99 mg/dL  Glucose, capillary     Status: None   Collection Time: 05/03/15  4:18 PM  Result Value Ref  Range   Glucose-Capillary 84 65 - 99 mg/dL  Glucose, capillary     Status: Abnormal   Collection Time: 05/03/15  7:58 PM  Result Value Ref Range   Glucose-Capillary 147 (H) 65 - 99 mg/dL  Glucose, capillary     Status: None   Collection Time: 05/04/15 12:05 AM  Result Value Ref Range   Glucose-Capillary 73 65 - 99 mg/dL  Glucose, capillary     Status: Abnormal   Collection Time: 05/04/15  4:03 AM  Result Value Ref Range   Glucose-Capillary 52 (L) 65 - 99 mg/dL  CBC     Status: Abnormal   Collection Time: 05/04/15  4:28 AM  Result Value Ref Range   WBC 8.2 4.0 - 10.5 K/uL   RBC 2.29 (L) 4.22 - 5.81 MIL/uL   Hemoglobin 7.6 (L) 13.0 - 17.0 g/dL   HCT 22.2 (L) 39.0 - 52.0 %   MCV 96.9 78.0 - 100.0 fL   MCH 33.2 26.0 - 34.0 pg   MCHC 34.2 30.0 - 36.0 g/dL   RDW 14.2 11.5 - 15.5 %   Platelets 304 150 - 400 K/uL  Basic metabolic panel     Status: Abnormal   Collection Time: 05/04/15  4:28 AM  Result Value Ref Range   Sodium 141 135 - 145 mmol/L   Potassium 3.5 3.5 - 5.1 mmol/L   Chloride 111 101 - 111 mmol/L   CO2 24 22 - 32 mmol/L   Glucose, Bld 109 (H) 65 - 99 mg/dL   BUN <5 (L) 6 - 20 mg/dL   Creatinine, Ser 1.13 0.61 - 1.24 mg/dL   Calcium 7.5 (L) 8.9 - 10.3 mg/dL   GFR calc non Af Amer 56 (L) >60 mL/min   GFR calc Af Amer >60 >60 mL/min    Comment: (NOTE) The eGFR has been calculated using the CKD EPI equation. This calculation has not been validated in all clinical situations. eGFR's persistently <60 mL/min signify possible Chronic Kidney Disease.    Anion gap 6 5 - 15  Glucose, capillary     Status: None   Collection Time: 05/04/15  4:36 AM  Result Value Ref Range   Glucose-Capillary 71 65 - 99 mg/dL  Glucose, capillary     Status: None   Collection Time:  05/04/15  7:40 AM  Result Value Ref Range   Glucose-Capillary 86 65 - 99 mg/dL  Folate     Status: None   Collection Time: 05/04/15  9:35 AM  Result Value Ref Range   Folate 16.1 >5.9 ng/mL  Reticulocytes     Status: Abnormal   Collection Time: 05/04/15  9:35 AM  Result Value Ref Range   Retic Ct Pct 1.3 0.4 - 3.1 %   RBC. 2.40 (L) 4.22 - 5.81 MIL/uL   Retic Count, Manual 31.2 19.0 - 186.0 K/uL  Glucose, capillary     Status: None   Collection Time: 05/04/15 12:08 PM  Result Value Ref Range   Glucose-Capillary 98 65 - 99 mg/dL    Vitals: Blood pressure 120/30, pulse 94, temperature 97.9 F (36.6 C), temperature source Axillary, resp. rate 19, height _0  (1.803 m), weight 65 kg (143 lb 4.8 oz), SpO2 100 %.  Risk to Self: Is patient at risk for suicide?: No Risk to Others:   Prior Inpatient Therapy:   Prior Outpatient Therapy:    Current Facility-Administered Medications  Medication Dose Route Frequency Provider Last Rate Last Dose  . antiseptic oral rinse (CPC /  CETYLPYRIDINIUM CHLORIDE 0.05%) solution 7 mL  7 mL Mouth Rinse q12n4p Roney Jaffe, MD   7 mL at 05/03/15 1600  . chlorhexidine (PERIDEX) 0.12 % solution 15 mL  15 mL Mouth Rinse BID Roney Jaffe, MD   15 mL at 05/04/15 0759  . enoxaparin (LOVENOX) injection 30 mg  30 mg Subcutaneous Q24H Allie Bossier, MD   30 mg at 05/03/15 1342  . feeding supplement (ENSURE ENLIVE) (ENSURE ENLIVE) liquid 237 mL  237 mL Oral TID BM Dale [place], RD   237 mL at 05/03/15 2057  . folic acid injection 1 mg  1 mg Intravenous Daily Kimberly B Hammons, RPH   1 mg at 05/03/15 1341  . insulin aspart (novoLOG) injection 0-9 Units  0-9 Units Subcutaneous 6 times per day Allie Bossier, MD   1 Units at 05/03/15 2057  . latanoprost (XALATAN) 0.005 % ophthalmic solution 1 drop  1 drop Both Eyes QHS Cherene Altes, MD   1 drop at 05/03/15 2100  . potassium chloride SA (K-DUR,KLOR-CON) CR tablet 40 mEq  40 mEq Oral Daily Eugenie Filler, MD   40 mEq at 05/03/15 1036  . RESOURCE THICKENUP CLEAR   Oral PRN Allie Bossier, MD      . thiamine (B-1) injection 100 mg  100 mg Intravenous Daily Theone Murdoch Hammons, RPH   100 mg at 05/03/15 1341    Musculoskeletal: Strength & Muscle Tone: decreased Gait & Station: unable to stand Patient leans: N/A  Psychiatric Specialty Exam: Physical Exam as per history and physical  ROS visual hallucinations, confusion, poor verbal response and disoriented. Unable to assess all the systems due to AMS  Blood pressure 120/30, pulse 94, temperature 97.9 F (36.6 C), temperature source Axillary, resp. rate 19, height _0  (1.803 m), weight 65 kg (143 lb 4.8 oz), SpO2 100 %.Body mass index is 20 kg/(m^2).  General Appearance: Disheveled and Guarded  Eye Contact::  Minimal  Speech:  Blocked, Garbled and Slow  Volume:  Decreased  Mood:  Depressed  Affect:  Depressed and Flat  Thought Process:  Irrelevant and Loose  Orientation:  Negative  Thought Content:  Hallucinations: Visual and Rumination  Suicidal Thoughts:  No  Homicidal Thoughts:  No  Memory:  Immediate;   Poor Recent;   Poor  Judgement:  Poor  Insight:  Negative  Psychomotor Activity:  Decreased  Concentration:  Poor  Recall:  Poor  Fund of Knowledge:Poor  Language: Fair  Akathisia:  Negative  Handed:  Right  AIMS (if indicated):     Assets:  Catering manager Housing Leisure Time Social Support Transportation  ADL's:  Impaired  Cognition: Impaired,  Severe  Sleep:      Medical Decision Making: Review of Psycho-Social Stressors (1), Review or order clinical lab tests (1), Established Problem, Worsening (2), Review of Last Therapy Session (1), Review or order medicine tests (1), Review of Medication Regimen & Side Effects (2) and Review of New Medication or Change in Dosage (2)  Treatment Plan Summary: patient has presented with visual hallucinations, withdrawn, depression, history of alcohol abuse and  multiple medical issues.  Daily contact with patient to assess and evaluate symptoms and progress in treatment and Medication management  Plan: Patient has no capacity to make medical decisions or living arrangements Will give a trial of seroquel 25 mg at bed time for hallucinations Patient does not meet criteria for psychiatric inpatient admission. Supportive therapy provided about ongoing stressors.  Appreciate psychiatric  consultation Please contact [phone] or [phone] if needs further assistance   Disposition: patient benefit from out of home placement - SNF as he needs 24 hours care when discharged from hospital.   [facility] R. 05/04/2015 12:51 PM

## 2015-05-04 NOTE — Progress Notes (Signed)
Hypoglycemic Event  CBG: 52  Treatment: D50 IV 25 mL  Symptoms: None  Follow-up CBG: Time:0436 CBG Result:71  Possible Reasons for Event: Inadequate meal intake    Greg Scott, Greg Scott  Remember to initiate Hypoglycemia Order Set & complete

## 2015-05-04 NOTE — Progress Notes (Signed)
Received call back from Md. No new orders. Will continue to monitor. Bess KindsGWALTNEY, Brynna Dobos B, RN

## 2015-05-04 NOTE — Progress Notes (Signed)
Pt order potassium 40 meq. Attempted to administer. Pt refused to take. Last K+ 3.5. MD notified. Will continue to monitor. Bess KindsGWALTNEY, Giavanni Odonovan B, RN

## 2015-05-05 DIAGNOSIS — D509 Iron deficiency anemia, unspecified: Secondary | ICD-10-CM | POA: Insufficient documentation

## 2015-05-05 LAB — BASIC METABOLIC PANEL
Anion gap: 6 (ref 5–15)
BUN: 5 mg/dL — ABNORMAL LOW (ref 6–20)
CHLORIDE: 111 mmol/L (ref 101–111)
CO2: 23 mmol/L (ref 22–32)
Calcium: 7.7 mg/dL — ABNORMAL LOW (ref 8.9–10.3)
Creatinine, Ser: 1.14 mg/dL (ref 0.61–1.24)
GFR calc Af Amer: >60 mL/min (ref >60)
GFR calc non Af Amer: 55 mL/min — ABNORMAL LOW (ref >60)
Glucose, Bld: 75 mg/dL (ref 65–99)
Potassium: 3.6 mmol/L (ref 3.5–5.1)
Sodium: 140 mmol/L (ref 135–145)

## 2015-05-05 LAB — CBC
HCT: 22.9 % — ABNORMAL LOW (ref 39.0–52.0)
HEMOGLOBIN: 7.6 g/dL — AB (ref 13.0–17.0)
MCH: 31.7 pg (ref 26.0–34.0)
MCHC: 33.2 g/dL (ref 30.0–36.0)
MCV: 95.4 fL (ref 78.0–100.0)
PLATELETS: 329 10*3/uL (ref 150–400)
RBC: 2.4 MIL/uL — ABNORMAL LOW (ref 4.22–5.81)
RDW: 14 % (ref 11.5–15.5)
WBC: 7.6 10*3/uL (ref 4.0–10.5)

## 2015-05-05 LAB — GLUCOSE, CAPILLARY
Glucose-Capillary: 63 mg/dL — ABNORMAL LOW (ref 65–99)
Glucose-Capillary: 74 mg/dL (ref 65–99)
Glucose-Capillary: 75 mg/dL (ref 65–99)
Glucose-Capillary: 82 mg/dL (ref 65–99)
Glucose-Capillary: 83 mg/dL (ref 65–99)
Glucose-Capillary: 97 mg/dL (ref 65–99)

## 2015-05-05 LAB — MAGNESIUM: Magnesium: 1.7 mg/dL (ref 1.7–2.4)

## 2015-05-05 MED ORDER — MAGNESIUM SULFATE 2 GM/50ML IV SOLN
2.0000 g | Freq: Once | INTRAVENOUS | Status: AC
  Administered 2015-05-05: 2 g via INTRAVENOUS
  Filled 2015-05-05: qty 50

## 2015-05-05 MED ORDER — POLYSACCHARIDE IRON COMPLEX 150 MG PO CAPS
150.0000 mg | ORAL_CAPSULE | Freq: Every day | ORAL | Status: DC
Start: 2015-05-05 — End: 2015-05-10
  Administered 2015-05-08 – 2015-05-10 (×2): 150 mg via ORAL
  Filled 2015-05-05 (×5): qty 1

## 2015-05-05 MED ORDER — CYANOCOBALAMIN 1000 MCG/ML IJ SOLN
1000.0000 ug | INTRAMUSCULAR | Status: DC
  Administered 2015-05-05: 1000 ug via INTRAMUSCULAR
  Filled 2015-05-05: qty 1

## 2015-05-05 MED ORDER — SODIUM CHLORIDE 0.9 % IV SOLN
25.0000 mg | Freq: Once | INTRAVENOUS | Status: AC
  Administered 2015-05-05: 25 mg via INTRAVENOUS
  Filled 2015-05-05: qty 0.5

## 2015-05-05 MED ORDER — DEXTROSE-NACL 5-0.9 % IV SOLN
INTRAVENOUS | Status: DC
  Administered 2015-05-05: 14:00:00 via INTRAVENOUS
  Administered 2015-05-05: 50 mL/h via INTRAVENOUS
  Administered 2015-05-06: 11:00:00 via INTRAVENOUS

## 2015-05-05 MED ORDER — SODIUM CHLORIDE 0.9 % IV SOLN
500.0000 mg | Freq: Every day | INTRAVENOUS | Status: AC
  Administered 2015-05-05 – 2015-05-06 (×2): 500 mg via INTRAVENOUS
  Filled 2015-05-05 (×2): qty 10

## 2015-05-05 NOTE — Progress Notes (Signed)
[facility] PROGRESS NOTE  JIMI SCHAPPERT ZOX:[id number] DOB: March 28, 1923 DOA: 04/22/2015 PCP: No primary care provider on file.  Admit HPI / Brief Narrative: Greg Scott is a 81 y.o. BM PMHx glaucoma/ blindness, lives at home with family. Has been doing more poorly over the last few months in terms of mobility. Pt's PCP thought that he might have a "pinched nerve" in his back causing bilat LE weakness and frequent falls that the pt was having. In the midst of these issues, the son went away for the weekend and a caretaker was supposed to look after Mr Goedken. On returning they found the pt on the floor with dried feces/ urine, poorly responsive to brought to ED.   In ED pt was hypothermic, BP 127/86, HR 88, temp 95.2. He looked dehydrated and has rec'd about 4L IVF's so far. Na 146 CO2 23 BUN 35 Creat 1.31. Alb 2.5, Ca 8.1, total bili 1.7, NH3 16. Lactic acid 2.1, WBC 8k, Hb 9.9, Plt 151 Trop 0.04 UA large small LE/ pos nitrite/ 3-6 wbc's CT head showed some small WM changes and moderate cortical volume loss, no acute CVA, no skull fracture  Assessment/Plan: Syncope and collapse -Likely multifactorial to include UTI, alcohol abuse (possible seizure); no evidence of stroke or MI -Echocardiogram; see results below -Troponin 3 mildly positive -Brain MRI/MRA negative for acute infarct or masses. -TSH within normal limit -Continue Sensitive SSI -EEG; normal  Alcohol abuse -Alcohol level negative - perhaps patient's presentation was due to acute alcohol withdrawal -urine drug screen negative  -Discontinued CIWA protocol, and all narcotics/sedating medication -Passed swallow study. Saline lock IV fluids.   Acute Encephalopathy/AMS  questionable etiology. Patient with confusion with some hallucinations. -See syncope and fall. -Discontinued all benzodiazepine and narcotics - Patient with some bouts of confusion and some hallucination. Patient may have a component of dementia. CT head   Negative for any acute abnormalities with moderate cortical volume loss and scattered small vessel ischemic microangiopathy. MRI of the head with moderate atrophy negative for acute infarct or mass. Folate levels at 16.1. B-12 levels at 397. B-12 levels are borderline will give a trial of vitamin B-12 1000 mcg [place] weekly 3 weeks.  Patient has been seen in consultation by psychiatry and patient has been started on low-dose Seroquel for hallucinations.  Per psychiatry patient with no capacity. Patient will likely need SNF with 24-hour care on discharge.  Psychiatry following.  -Swallow study; passed; dysphagia 1 honey thick  Urinary tract infection with hematuria -Completed 5 day course antibiotics   iron deficiency Anemia May be dilutional in nature. Patient with no overt bleeding.  Anemia panel with iron level of 21. Check FOBT. Will give IV iron. Will need oral iron supplementation. Hemoglobin currently stable at 7.6. Follow H&H. Transfusion threshold hemoglobin less than 7.  Rib fracture -Remote right fifth and sixth rib fractures; monitor for now  Hypokalemia/Hyperkalemia -Potassium goal> 4 -Kayexalate 30 gm 1 for hyperkalemia.  Hypomagnesemia  -Replete. Keep magnesium goal > 2   Stage II sacral decubitus Continue frequent turns.  Prophylaxis Heparin for DVT prophylaxis.     Code Status: Full Family Communication: No family at bedside. Disposition Plan: SNF    Consultants:  Psychiatry:  Dr. Elsie Saas 05/04/2015  Procedures: 7/4 CT head without contrast;No acute intracranial pathology -Moderate cortical volume loss and scattered small vessel ischemic microangiopathy. 7/5 MRI brain without contrast; -moderate atrophy. -Negative for acute infarct or mass.  7/6 echocardiogram;- LVEF= 55%- 60%. Hypokinesis of the mid-apicalanteroseptal  myocardium. 7/8 EEG; normal  Culture 7/4 blood left arm/hand NGTD 7/4 urine negative 7/5 MRSA by PCR  negative   Antibiotics:  IV Zosyn 04/22/15 >>> 04/24/15  IV vancomycin 04/22/2015>>>>> 04/24/2015  IV Rocephin 04/24/2015>>>>>> 04/28/2015  HPI/Subjective: Patient  Alert. Patient with incomprehensible speech. Patient confused.  Objective: Filed Vitals:   05/05/15 0551  BP: 141/60  Pulse: 80  Temp: 98.2 F (36.8 C)  Resp: 19    Intake/Output Summary (Last 24 hours) at 05/05/15 0944 Last data filed at 05/04/15 1726  Gross per 24 hour  Intake     20 ml  Output    500 ml  Net   -480 ml   Filed Weights   04/23/15 0330 05/02/15 0322 05/03/15 2017  Weight: 56.4 kg (124 lb 5.4 oz) 63.3 kg (139 lb 8.8 oz) 65 kg (143 lb 4.8 oz)    Exam:   General:  NAD. Mittens on  Cardiovascular: RRR  Respiratory: CTAB  Abdomen: Soft/NT/ND/+BS  Musculoskeletal: No c/c/e  Data Reviewed: Basic Metabolic Panel:  Recent Labs Lab 04/29/15 0218 04/30/15 0235 05/01/15 0930 05/02/15 0612 05/02/15 1525 05/03/15 0359 05/04/15 0428 05/05/15 0421  NA 145 143 142 141  --  139 141 140  K 4.0 3.9 3.3* 2.9* 3.3* 3.4* 3.5 3.6  CL 115* 115* 114* 109  --  110 111 111  CO2 25 21* 23 24  --  GLUCOSE 132* 95 109* 94  --  93 109* 75  BUN <5* 6 5* <5*  --  <5* <5* 5*  CREATININE 1.01 1.20 0.98 1.06  --  1.08 1.13 1.14  CALCIUM 8.1* 7.8* 7.8* 8.0*  --  7.5* 7.5* 7.7*  MG 1.5* 1.9 1.8 1.7  --   --   --  1.7   Liver Function Tests:  Recent Labs Lab 04/29/15 0218 04/30/15 0235  AST 34 35  ALT 28 27  ALKPHOS 59 55  BILITOT 0.8 0.7  PROT 4.9* 4.7*  ALBUMIN 1.9* 1.8*   No results for input(s): LIPASE, AMYLASE in the last 168 hours. No results for input(s): AMMONIA in the last 168 hours. CBC:  Recent Labs Lab 04/29/15 0218 05/01/15 0930 05/02/15 0612 05/03/15 0359 05/04/15 0428 05/05/15 0421  WBC 7.0 7.0 8.1 7.0 8.2 7.6  NEUTROABS 5.6  --   --   --   --   --   HGB 9.3* 8.5* 9.1* 7.9* 7.6* 7.6*  HCT 27.3* 25.4* 27.2* 23.4* 22.2* 22.9*  MCV 98.9 96.2 96.1 95.1 96.9  95.4  PLT 199 252 300 288 304 329   Cardiac Enzymes: No results for input(s): CKTOTAL, CKMB, CKMBINDEX, TROPONINI in the last 168 hours. BNP (last 3 results) No results for input(s): BNP in the last 8760 hours.  ProBNP (last 3 results) No results for input(s): PROBNP in the last 8760 hours.  CBG:  Recent Labs Lab 05/04/15 1555 05/04/15 1947 05/04/15 2336 05/05/15 0357 05/05/15 0809  GLUCAP 73 75 63* 74 83    Recent Results (from the past 240 hour(s))  Clostridium Difficile by PCR (not at [facility])     Status: None   Collection Time: 05/01/15  5:10 PM  Result Value Ref Range Status   C difficile by pcr NEGATIVE NEGATIVE Final     Studies: No results found.  Scheduled Meds: . antiseptic oral rinse  7 mL Mouth Rinse q12n4p  . chlorhexidine  15 mL Mouth Rinse BID  . enoxaparin (LOVENOX) injection  30 mg Subcutaneous Q24H  .  feeding supplement (ENSURE ENLIVE)  237 mL Oral TID BM  . folic acid  1 mg Intravenous Daily  . insulin aspart  0-9 Units Subcutaneous 6 times per day  . iron polysaccharides  150 mg Oral Daily  . latanoprost  1 drop Both Eyes QHS  . potassium chloride  40 mEq Oral Daily  . QUEtiapine  25 mg Oral QHS  . thiamine  100 mg Intravenous Daily   Continuous Infusions: . dextrose 5 % and 0.9% NaCl 50 mL/hr (05/05/15 0042)    Principal Problem:   Altered mental status Active Problems:   Volume depletion   Failure to thrive in adult   Blind   Glaucoma   Pyuria   Syncope and collapse   Alcohol abuse   Acute cystitis with hematuria   Fractured rib   Hypokalemia   Pressure ulcer   UTI (lower urinary tract infection)   Hypomagnesemia   Acute encephalopathy   Urinary tract infectious disease   Rib fracture   Encephalopathy acute   Hyperkalemia   Protein-calorie malnutrition, severe    Time spent: 35 minutes    THOMPSON,DANIEL M.D. [facility] Pager [phone]. If 7PM-7AM, please contact night-coverage at [email or web address], password  [facility] 05/05/2015, 9:44 AM  LOS: 12 days

## 2015-05-05 NOTE — Clinical Social Work Note (Signed)
CSW spoke with patient's son Greg Scott by phone to provide bed offers that patient has received. CSW has encouraged Greg Scott to call unit CSW in the morning with decision of facility. SNF list with offers has been left on the patient's chart.    [name] [name] MSW, [place]CSWA, [place], [phone]

## 2015-05-05 NOTE — Progress Notes (Signed)
Patient refusing PO intake.  MD notified. Will continue to monitor.

## 2015-05-05 NOTE — Plan of Care (Signed)
Problem: Phase II Progression Outcomes Goal: IV changed to normal saline lock Outcome: Not Progressing D5NS started tonight secondary to patient refusing all PO intake and CBG low.

## 2015-05-05 NOTE — Progress Notes (Signed)
Pharmacy Consult: IV iron Indication: Iron deficiency anemia   81 yo male admitted with frequent falls, bilateral LE weakness, has iron deficiency anemia, not associated with CKD. Normal renal fx, eCrCl ~ 40 ml/min. Iron defecit calculated, pt will require at least 1500 mg.  Caution with IV iron is recommend with elevated ferritin. Studies have shown IV iron may not be effective with elevated ferritin, this is controversial given ferritin is an acute phase reactant and can be elevated due to other reasons as well. Hgb 7.6 Ferritin 691 Fe 21   Plan -Iron dextran 25 mg IV x1 test dose then 500 mg IV daily x2 days -PO iron has also been ordered by MD   Agapito GamesAlison Temprance Wyre, PharmD, BCPS Clinical Pharmacist Pager: [phone] 05/05/2015 9:48 AM

## 2015-05-06 LAB — BASIC METABOLIC PANEL
ANION GAP: 7 (ref 5–15)
BUN: <5 mg/dL — ABNORMAL LOW (ref 6–20)
CALCIUM: 7.5 mg/dL — AB (ref 8.9–10.3)
CO2: 22 mmol/L (ref 22–32)
Chloride: 113 mmol/L — ABNORMAL HIGH (ref 101–111)
Creatinine, Ser: 1.03 mg/dL (ref 0.61–1.24)
GFR calc Af Amer: >60 mL/min (ref >60)
GFR calc non Af Amer: >60 mL/min (ref >60)
GLUCOSE: 90 mg/dL (ref 65–99)
Potassium: 3.6 mmol/L (ref 3.5–5.1)
SODIUM: 142 mmol/L (ref 135–145)

## 2015-05-06 LAB — CBC
HCT: 22.2 % — ABNORMAL LOW (ref 39.0–52.0)
Hemoglobin: 7.6 g/dL — ABNORMAL LOW (ref 13.0–17.0)
MCH: 32.1 pg (ref 26.0–34.0)
MCHC: 34.2 g/dL (ref 30.0–36.0)
MCV: 93.7 fL (ref 78.0–100.0)
PLATELETS: 298 10*3/uL (ref 150–400)
RBC: 2.37 MIL/uL — AB (ref 4.22–5.81)
RDW: 14.1 % (ref 11.5–15.5)
WBC: 7.9 10*3/uL (ref 4.0–10.5)

## 2015-05-06 LAB — GLUCOSE, CAPILLARY
GLUCOSE-CAPILLARY: 103 mg/dL — AB (ref 65–99)
GLUCOSE-CAPILLARY: 109 mg/dL — AB (ref 65–99)
GLUCOSE-CAPILLARY: 209 mg/dL — AB (ref 65–99)
GLUCOSE-CAPILLARY: 67 mg/dL (ref 65–99)
GLUCOSE-CAPILLARY: 86 mg/dL (ref 65–99)
Glucose-Capillary: 89 mg/dL (ref 65–99)
Glucose-Capillary: 79 mg/dL (ref 65–99)

## 2015-05-06 MED ORDER — DRONABINOL 2.5 MG PO CAPS
2.5000 mg | ORAL_CAPSULE | Freq: Two times a day (BID) | ORAL | Status: DC
  Filled 2015-05-06: qty 1

## 2015-05-06 MED ORDER — CYANOCOBALAMIN 1000 MCG/ML IJ SOLN
1000.0000 ug | Freq: Once | INTRAMUSCULAR | Status: AC
  Administered 2015-05-06: 1000 ug via INTRAMUSCULAR
  Filled 2015-05-06: qty 1

## 2015-05-06 MED ORDER — QUETIAPINE FUMARATE 50 MG PO TABS
50.0000 mg | ORAL_TABLET | Freq: Every day | ORAL | Status: DC
  Filled 2015-05-06 (×4): qty 1

## 2015-05-06 MED ORDER — DEXTROSE 50 % IV SOLN
INTRAVENOUS | Status: AC
  Administered 2015-05-06: 50 mL
  Filled 2015-05-06: qty 50

## 2015-05-06 NOTE — Clinical Social Work Note (Addendum)
Call made to son, Greg Scott [phone]) to get his facility decision and message left.  CSW talked with son at 3:45 pm regarding facility decision and Greg Scott was chosen.  Mr. Greg Scott informed CSW that he had just spoken with MD regarding patient and Palliative services was discussed with him. CSW provided additional clarity regarding Palliative services and informed son that Greg Scott will be contacted on Tuesday.  Genelle BalVanessa Darshan Solanki, MSW, LCSW Licensed Clinical Social Worker Clinical Social Work Department [facility] [phone]

## 2015-05-06 NOTE — Progress Notes (Addendum)
Patient has refused to take any of his PO medications. MD notified. RN will continue to monitor patient.  Veatrice KellsMahmoud,Lizzet Hendley I, RN

## 2015-05-06 NOTE — Progress Notes (Signed)
Physical Therapy Treatment Patient Details Name: Greg StallsFred A Scott MRN: [id number] DOB: 1923/05/16 Today's Date: 05/06/2015    History of Present Illness Patient is an 81 yo male admitted 04/22/15 after being found on floor.  Patient with AMS, UTI, BLE weakness, sacral decubitus.  PMH:  glaucoma/blindness, ETOH abuse    PT Comments    Showing more ability to participate today, and tolerated lateral scoot transfer OOB; Ended session with pt on Geomat pressure redistribution cushion, positioned in a way to reduce the likelihood that he will scoot into sacral sitting  Follow Up Recommendations  SNF;Supervision/Assistance - 24 hour     Equipment Recommendations  Wheelchair (measurements PT);Wheelchair cushion (measurements PT) (and continuing to be determined)    Recommendations for Other Services       Precautions / Restrictions Precautions Precautions: Fall Restrictions Other Position/Activity Restrictions: Lt foot painful to touch    Mobility  Bed Mobility Overal bed mobility: Needs Assistance;+2 for physical assistance Bed Mobility: Supine to Sit     Supine to sit: Max assist     General bed mobility comments: Verbal and tactile cues for mobility.  Patient able to initiate movement of LE's off of bed.  Required +2 max assist to move to sitting.  Once upright, required mod assist to maintain balance, leaning posteriorly and to right.  Patient sat EOB x 5 minutes.  Returned to supine with +2 total assist.  Transfers Overall transfer level: Needs assistance Equipment used:  (bed pad) Transfers: Lateral/Scoot Transfers          Lateral/Scoot Transfers: +2 physical assistance;Max assist General transfer comment: Lateral scoot transfer to recliner with drop arm down; knees blocked for stability; used bed pad to cradle hips  Ambulation/Gait                 Stairs            Wheelchair Mobility    Modified Rankin (Stroke Patients Only)       Balance      Sitting balance-[name]: Poor Sitting balance - Comments: Requires heavymod assist to maintain upright position and balance.  Postural control: Posterior lean;Right lateral lean;Left lateral lean                          Cognition Arousal/Alertness: Awake/alert Behavior During Therapy: Restless Overall Cognitive Status: No family/caregiver present to determine baseline cognitive functioning                      Exercises      General Comments        Pertinent Vitals/Pain Pain Assessment: Faces Faces Pain Scale: Hurts even more Pain Location: L foot whent touched Pain Descriptors / Indicators: Grimacing Pain Intervention(s): Limited activity within patient's tolerance;Monitored during session;Repositioned    Home Living                      Prior Function            PT Goals (current goals can now be found in the care plan section) Acute Rehab PT Goals Patient Stated Goal: None stated PT Goal Formulation: Patient unable to participate in goal setting Time For Goal Achievement: 05/16/15 Potential to Achieve Goals: Fair Progress towards PT goals: Progressing toward goals    Frequency  Min 2X/week    PT Plan Current plan remains appropriate    Co-evaluation  End of Session Equipment Utilized During Treatment:  (bed pad) Activity Tolerance: Patient tolerated treatment well Patient left: in chair;with call bell/phone within reach;with chair alarm set;Other (comment) (positioned to avoid pt from scooting into sacral sitting)     Time: 1420-1440 PT Time Calculation (min) (ACUTE ONLY): 20 min  Charges:  $Therapeutic Activity: 8-22 mins                    G Codes:      Olen Pel 05/06/2015, 4:20 PM  Van Clines, [place]  Acute Rehabilitation Services Pager [phone] Office [phone]

## 2015-05-06 NOTE — Progress Notes (Signed)
[facility] PROGRESS NOTE  Greg Scott JXB:[id number] DOB: [birth year] DOA: 04/22/2015 PCP: No primary care provider on file.  Admit HPI / Brief Narrative: Greg Scott is a 81 y.o. BM PMHx glaucoma/ blindness, lives at home with family. Has been doing more poorly over the last few months in terms of mobility. Pt's PCP thought that he might have a "pinched nerve" in his back causing bilat LE weakness and frequent falls that the pt was having. In the midst of these issues, the son went away for the weekend and a caretaker was supposed to look after Greg Scott. On returning they found the pt on the floor with dried feces/ urine, poorly responsive to brought to ED.   In ED pt was hypothermic, BP 127/86, HR 88, temp 95.2. He looked dehydrated and has rec'd about 4L IVF's so far. Na 146 CO2 23 BUN 35 Creat 1.31. Alb 2.5, Ca 8.1, total bili 1.7, NH3 16. Lactic acid 2.1, WBC 8k, Hb 9.9, Plt 151 Trop 0.04 UA large small LE/ pos nitrite/ 3-6 wbc's CT head showed some small WM changes and moderate cortical volume loss, no acute CVA, no skull fracture  Assessment/Plan: Syncope and collapse -Likely multifactorial to include UTI, alcohol abuse (possible seizure); no evidence of stroke or MI -Echocardiogram; see results below -Troponin 3 mildly positive -Brain MRI/MRA negative for acute infarct or masses. -TSH within normal limit -Continue Sensitive SSI -EEG; normal  Alcohol abuse -Alcohol level negative - perhaps patient's presentation was due to acute alcohol withdrawal -urine drug screen negative  -Discontinued CIWA protocol, and all narcotics/sedating medication -Passed swallow study. Saline lock IV fluids.   Acute Encephalopathy/AMS  questionable etiology. Patient with confusion with some hallucinations. -See syncope and fall. -Discontinued all benzodiazepine and narcotics - Patient with some bouts of confusion and some hallucination. Patient may have a component of dementia. CT head   Negative for any acute abnormalities with moderate cortical volume loss and scattered small vessel ischemic microangiopathy. MRI of the head with moderate atrophy negative for acute infarct or mass. Folate levels at 16.1. B-12 levels at 397. B-12 levels are borderline will give a trial of vitamin B-12 1000 mcg [place] weekly 3 weeks.  Patient has been seen in consultation by psychiatry and patient has been started on low-dose Seroquel for hallucinations. Will increase Seroquel to 50 mg daily at bedtime. Per psychiatry patient with no capacity. Patient will likely need SNF with 24-hour care on discharge.  Psychiatry following.  -Swallow study; passed; dysphagia 1 honey thick  Urinary tract infection with hematuria -Completed 5 day course antibiotics   iron deficiency Anemia May be dilutional in nature. Patient with no overt bleeding.  Anemia panel with iron level of 21. Patient currently receiving IV iron. Patient also on oral iron supplementation. Hemoglobin currently stable at 7.6. Follow H&H. Transfusion threshold hemoglobin less than 7.  Rib fracture -Remote right fifth and sixth rib fractures; monitor for now  Hypokalemia/Hyperkalemia -Potassium goal> 4 -Kayexalate 30 gm 1 for hyperkalemia.  Hypomagnesemia  -Replete. Keep magnesium goal > 2   Stage II sacral decubitus Continue frequent turns.  Prophylaxis Heparin for DVT prophylaxis.  Failure to thrive Patient with failure to thrive patient refusing oral intake. Patient with confusion and hallucinations. Patient with CT head and MRI of the head negative for acute abnormalities have a does have moderate atrophy and patient likely does have underlying dementia. Patient refusing oral medications. Patient refusing to eat. Will place patient on Marinol tablets to see if  this improves his diet. Will also consulted palliative care for goals of care and further evaluation and management.     Code Status: Full Family Communication: Updated  son via telephone.  Disposition Plan: SNF. Pending palliative care consultation    Consultants:  Psychiatry:  Dr. Elsie Saas 05/04/2015  Procedures: 7/4 CT head without contrast;No acute intracranial pathology -Moderate cortical volume loss and scattered small vessel ischemic microangiopathy. 7/5 MRI brain without contrast; -moderate atrophy. -Negative for acute infarct or mass.  7/6 echocardiogram;- LVEF= 55%- 60%. Hypokinesis of the mid-apicalanteroseptal myocardium. 7/8 EEG; normal  Culture 7/4 blood left arm/hand NGTD 7/4 urine negative 7/5 MRSA by PCR negative   Antibiotics:  IV Zosyn 04/22/15 >>> 04/24/15  IV vancomycin 04/22/2015>>>>> 04/24/2015  IV Rocephin 04/24/2015>>>>>> 04/28/2015  HPI/Subjective: Patient  Alert. Patient sitting in a chair. Patient confused. Patient hallucinating. Patient refusing oral medications. Patient with poor oral intake. Patient states he just not feeling hungry.  Objective: Filed Vitals:   05/06/15 0900  BP: 128/67  Pulse: 95  Temp: 97.3 F (36.3 C)  Resp: 18    Intake/Output Summary (Last 24 hours) at 05/06/15 1528 Last data filed at 05/06/15 1300  Gross per 24 hour  Intake 860.83 ml  Output      0 ml  Net 860.83 ml   Filed Weights   04/23/15 0330 05/02/15 0322 05/03/15 2017  Weight: 56.4 kg (124 lb 5.4 oz) 63.3 kg (139 lb 8.8 oz) 65 kg (143 lb 4.8 oz)    Exam:   General:  NAD. Mittens on  Cardiovascular: RRR  Respiratory: CTAB  Abdomen: Soft/NT/ND/+BS  Musculoskeletal: No c/c/e  Data Reviewed: Basic Metabolic Panel:  Recent Labs Lab 04/30/15 0235 05/01/15 0930 05/02/15 0612 05/02/15 1525 05/03/15 0359 05/04/15 0428 05/05/15 0421 05/06/15 0536  NA 143 142 141  --  139 141 140 142  K 3.9 3.3* 2.9* 3.3* 3.4* 3.5 3.6 3.6  CL 115* 114* 109  --  110 111 111 113*  CO2 21* 23 24  --  GLUCOSE 95 109* 94  --  93 109* 75 90  BUN 6 5* <5*  --  <5* <5* 5* <5*  CREATININE 1.20 0.98 1.06  --   1.08 1.13 1.14 1.03  CALCIUM 7.8* 7.8* 8.0*  --  7.5* 7.5* 7.7* 7.5*  MG 1.9 1.8 1.7  --   --   --  1.7  --    Liver Function Tests:  Recent Labs Lab 04/30/15 0235  AST 35  ALT 27  ALKPHOS 55  BILITOT 0.7  PROT 4.7*  ALBUMIN 1.8*   No results for input(s): LIPASE, AMYLASE in the last 168 hours. No results for input(s): AMMONIA in the last 168 hours. CBC:  Recent Labs Lab 05/02/15 0612 05/03/15 0359 05/04/15 0428 05/05/15 0421 05/06/15 0536  WBC 8.1 7.0 8.2 7.6 7.9  HGB 9.1* 7.9* 7.6* 7.6* 7.6*  HCT 27.2* 23.4* 22.2* 22.9* 22.2*  MCV 96.1 95.1 96.9 95.4 93.7  PLT 300 288 304 329 298   Cardiac Enzymes: No results for input(s): CKTOTAL, CKMB, CKMBINDEX, TROPONINI in the last 168 hours. BNP (last 3 results) No results for input(s): BNP in the last 8760 hours.  ProBNP (last 3 results) No results for input(s): PROBNP in the last 8760 hours.  CBG:  Recent Labs Lab 05/05/15 2000 05/06/15 0014 05/06/15 0408 05/06/15 0840 05/06/15 1146  GLUCAP 97 103* 89 86 109*    Recent Results (from the past 240 hour(s))  Clostridium Difficile by PCR (not at [facility])     Status: None   Collection Time: 05/01/15  5:10 PM  Result Value Ref Range Status   C difficile by pcr NEGATIVE NEGATIVE Final     Studies: No results found.  Scheduled Meds: . antiseptic oral rinse  7 mL Mouth Rinse q12n4p  . chlorhexidine  15 mL Mouth Rinse BID  . cyanocobalamin  1,000 mcg Intramuscular Weekly  . enoxaparin (LOVENOX) injection  30 mg Subcutaneous Q24H  . feeding supplement (ENSURE ENLIVE)  237 mL Oral TID BM  . folic acid  1 mg Intravenous Daily  . insulin aspart  0-9 Units Subcutaneous 6 times per day  . iron dextran (INFED/DEXFERRUM) infusion  500 mg Intravenous Daily  . iron polysaccharides  150 mg Oral Daily  . latanoprost  1 drop Both Eyes QHS  . QUEtiapine  25 mg Oral QHS  . thiamine  100 mg Intravenous Daily   Continuous Infusions:    Principal Problem:   Altered mental  status Active Problems:   Volume depletion   Failure to thrive in adult   Blind   Glaucoma   Pyuria   Syncope and collapse   Alcohol abuse   Acute cystitis with hematuria   Fractured rib   Hypokalemia   Pressure ulcer   UTI (lower urinary tract infection)   Hypomagnesemia   Acute encephalopathy   Urinary tract infectious disease   Rib fracture   Encephalopathy acute   Hyperkalemia   Protein-calorie malnutrition, severe   Anemia, iron deficiency    Time spent: 35 minutes    Proctor Carriker M.D. [facility] Pager [phone]. If 7PM-7AM, please contact night-coverage at [email or web address], password [facility] 05/06/2015, 3:28 PM  LOS: 13 days

## 2015-05-06 NOTE — Care Management Important Message (Signed)
Important Message  Patient Details  Name: Greg Scott MRN: [id number] Date of Birth: 1923/04/06   Medicare Important Message Given:  Yes-second notification given    Orson AloeMegan P Mishael Krysiak 05/06/2015, 2:00 PM

## 2015-05-07 DIAGNOSIS — Z7189 Other specified counseling: Secondary | ICD-10-CM

## 2015-05-07 DIAGNOSIS — Z515 Encounter for palliative care: Secondary | ICD-10-CM

## 2015-05-07 DIAGNOSIS — E162 Hypoglycemia, unspecified: Secondary | ICD-10-CM | POA: Insufficient documentation

## 2015-05-07 LAB — CBC
HEMATOCRIT: 24 % — AB (ref 39.0–52.0)
HEMOGLOBIN: 8 g/dL — AB (ref 13.0–17.0)
MCH: 31.6 pg (ref 26.0–34.0)
MCHC: 33.3 g/dL (ref 30.0–36.0)
MCV: 94.9 fL (ref 78.0–100.0)
PLATELETS: 337 10*3/uL (ref 150–400)
RBC: 2.53 MIL/uL — AB (ref 4.22–5.81)
RDW: 13.9 % (ref 11.5–15.5)
WBC: 8.8 10*3/uL (ref 4.0–10.5)

## 2015-05-07 LAB — BASIC METABOLIC PANEL
Anion gap: 6 (ref 5–15)
CALCIUM: 8.1 mg/dL — AB (ref 8.9–10.3)
CO2: 25 mmol/L (ref 22–32)
Chloride: 112 mmol/L — ABNORMAL HIGH (ref 101–111)
Creatinine, Ser: 1.12 mg/dL (ref 0.61–1.24)
GFR calc non Af Amer: 56 mL/min — ABNORMAL LOW (ref >60)
GLUCOSE: 38 mg/dL — AB (ref 65–99)
POTASSIUM: 3 mmol/L — AB (ref 3.5–5.1)
Sodium: 143 mmol/L (ref 135–145)

## 2015-05-07 LAB — GLUCOSE, CAPILLARY
GLUCOSE-CAPILLARY: 38 mg/dL — AB (ref 65–99)
GLUCOSE-CAPILLARY: 37 mg/dL — AB (ref 65–99)
GLUCOSE-CAPILLARY: 69 mg/dL (ref 65–99)
GLUCOSE-CAPILLARY: 141 mg/dL — AB (ref 65–99)
GLUCOSE-CAPILLARY: 154 mg/dL — AB (ref 65–99)
Glucose-Capillary: 125 mg/dL — ABNORMAL HIGH (ref 65–99)
Glucose-Capillary: 169 mg/dL — ABNORMAL HIGH (ref 65–99)
Glucose-Capillary: 65 mg/dL (ref 65–99)
Glucose-Capillary: 89 mg/dL (ref 65–99)

## 2015-05-07 MED ORDER — DOCUSATE SODIUM 100 MG PO CAPS
100.0000 mg | ORAL_CAPSULE | Freq: Two times a day (BID) | ORAL | Status: DC
  Administered 2015-05-08: 100 mg via ORAL
  Filled 2015-05-07 (×6): qty 1

## 2015-05-07 MED ORDER — DEXTROSE 50 % IV SOLN
INTRAVENOUS | Status: AC
  Administered 2015-05-07: 50 mL
  Filled 2015-05-07: qty 50

## 2015-05-07 MED ORDER — DEXTROSE-NACL 5-0.9 % IV SOLN
INTRAVENOUS | Status: DC
  Administered 2015-05-07 – 2015-05-10 (×4): via INTRAVENOUS

## 2015-05-07 MED ORDER — DEXTROSE-NACL 5-0.9 % IV SOLN
INTRAVENOUS | Status: DC
  Filled 2015-05-07: qty 1000

## 2015-05-07 NOTE — Care Management Note (Signed)
Case Management Note  Patient Details  Name: [name] A Stech MRN: [id number] Date of Birth: 1923-10-04  Subjective/Objective:        CM following for progression and d/c needs.            Action/Plan:  Plan for SNF placement on hold due to pt refusing po meds and meals. Pall care to discuss plan with son, and decision re possible feeding tube to be presented.  Expected Discharge Date:       05/08/2015           Expected Discharge Plan:  Skilled Nursing Facility  In-House Referral:  Clinical Social Work  Discharge planning Services  NA  Post Acute Care Choice:    Choice offered to:     DME Arranged:    DME Agency:     HH Arranged:    HH Agency:     Status of Service:  In process, will continue to follow  Medicare Important Message Given:  Yes-second notification given Date Medicare IM Given:    Medicare IM give by:    Date Additional Medicare IM Given:    Additional Medicare Important Message give by:     If discussed at Long Length of Stay Meetings, dates discussed:    Additional Comments:  Starlyn SkeansRoyal, Anora Schwenke U, RN 05/07/2015, 12:24 PM

## 2015-05-07 NOTE — Progress Notes (Signed)
Hypoglycemic Event  CBG: 37  Treatment:  d5 NS started at 3575ml/hr per order   Symptoms: None  Follow-up CBG: Time:1330 CBG Result:6+9  Possible Reasons for Event: Inadequate meal intake  Comments/MD notifiedyes    Mamie NickKnisley, Avabella Wailes E  Remember to initiate Hypoglycemia Order Set & complete

## 2015-05-07 NOTE — Consult Note (Signed)
Consultation Note Date: 05/07/2015   Patient Name: Greg Scott  DOB: 11-05-1922  MRN: [id number]  Age / Sex: 81 y.o., male   PCP: No primary care provider on file. Referring Physician: Rodolph Bong, MD  Reason for Consultation: Establishing goals of care and Psychosocial/spiritual support  Palliative Care Assessment and Plan Summary of Established Goals of Care and Medical Treatment Preferences   Clinical Assessment/Narrative: Greg Scott is lying in bed with mittens on his hands, resting with his eyes closed.  He is blind and doesn't offer to make eye contact.  He states his fingernails are hurting, but declines to say more.  Very limited interaction with Greg Scott as he is unable to speak clearly.    Call to son, Kason Benak, and we talk at length about Greg Scott life for the last few months.  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   He lost his wife in 2009, and Lander states Greg Scott has been drinking more, with a noted increase to daytime drinking in the last few months.  Jeshawn states that Greg Scott has talked about depression and "You don't know what I go through in this house".    He was having trouble standing/walking also and having repeated falls.  We talk about his weight loss, "probably because he wasn't eating".   They had discussed the option of moving to an ALF a few months ago.  Mr Chavira was managing his own meds, but only took eye drops.  We talk about increase in Seroquel, and addition of Marinol for appetite.  We talk about the natural process of declining appetite with aging and dementia, along with dysphagia and the risks associated with tube feeding.  We talk about eating less, interacting less.    Kairon states he doesn't think that Greg Scott will ever be able to return home.  We talk about the 'next steps' with Greg Scott going to SNF, the possibility of rehab, and the cost.  He is encouraged to work closely  with the SW at the rehab.  We talk about chronic illness trajectory, and how this seems to have "happened so fast".  Rishawn is encouraged to look at the MOST form to help guide decisions regarding his fathers future.    Contacts/Participants in Discussion: Primary Decision Maker: Son, Cai Anfinson is primary decision maker.  He has no siblings.  HCPOA: yes  Son, Kelvin Sennett   Code Status/Advance Care Planning:  FULL code at this time.  We talk about MOST form and choices, but no decision today. Lillard states they have never talked about end of life choices.    Mr. Gomer had been living independently.  His son, Warwick, talks about Greg Scott decline over the last few months with increased falls, weight loss, and more drinking during the day time.  We discussed the realities of chest compressions, intubation, and defibrillation; we also talked about that DNR does not mean no care.  We talk about Demetrio's experiences stopping vent support and tube feeding for his mother, and how Greg Scott would at times "try to help her pull her tubes out".  Teodor is encouraged to view the MOST form and the choices has has for care.   Symptom Management:   No complaints or s/s of pain.   Palliative Prophylaxis: No stoollsoftener at this time, patient is taking iron supplementation.   Psycho-social/Spiritual:   Support System:  Son,  Greg Scott, has no siblings. His mother in law has dementia, lives with them, and they are trying to find placement for her at this time.   Desire for further Chaplaincy support: Not discussed today.   Prognosis: Unable to determine  Discharge Planning:  Skilled Nursing Facility for rehab with Palliative care service follow-up       Chief Complaint:  Altered mental status.  History of Present Illness:   Greg Scott A Denicola is a 81 y.o. male with hx glaucoma/ blindness, lives at home with family. Has been doing more poorly over the last few months in terms of mobility. Pt's PCP thought that he might have  a "pinched nerve" in his back causing bilat LE weakness and frequent falls that the pt was having. In the midst of these issues, the son went away for the weekend and a caretaker was supposed to look after [name]. On returning they found the pt on the floor with dried feces/ urine, poorly responsive to brought to ED. He has completed antibiotics, and has not had noted improvement to his mental state.  He continues to speak unintelligable words. Goal is now to d/c to SNF for rehab and possible permanent placement.   Primary Diagnoses  Present on Admission:  . (Resolved) Dehydration . Volume depletion . Altered mental status . Failure to thrive in adult . Blind . Glaucoma . Pyuria . Syncope and collapse . Alcohol abuse . Acute cystitis with hematuria . Fractured rib . Hypokalemia . Pressure ulcer . UTI (lower urinary tract infection) . Hypomagnesemia . Acute encephalopathy . Urinary tract infectious disease . Rib fracture . Encephalopathy acute . Hyperkalemia  Palliative Review of Systems: Greg Scott is unable to participate in ROS at this time.   I have reviewed the medical record, interviewed the patient and family, and examined the patient. The following aspects are pertinent.  Past Medical History  Diagnosis Date  . Glaucoma   . Blind    History   Social History  . Marital Status: Married    Spouse Name: Greg Scott  . Number of Children: Greg Scott  . Years of Education: Greg Scott   Social History Main Topics  . Smoking status: Never Smoker   . Smokeless tobacco: Not on file  . Alcohol Use: Not on file  . Drug Use: Not on file  . Sexual Activity: Not on file   Other Topics Concern  . None   Social History Narrative   History reviewed. No pertinent family history. Scheduled Meds: . antiseptic oral rinse  7 mL Mouth Rinse q12n4p  . chlorhexidine  15 mL Mouth Rinse BID  . cyanocobalamin  1,000 mcg Intramuscular Weekly  . dronabinol  2.5 mg Oral BID AC  . enoxaparin (LOVENOX)  injection  30 mg Subcutaneous Q24H  . feeding supplement (ENSURE ENLIVE)  237 mL Oral TID BM  . folic acid  1 mg Intravenous Daily  . insulin aspart  0-9 Units Subcutaneous 6 times per day  . iron polysaccharides  150 mg Oral Daily  . latanoprost  1 drop Both Eyes QHS  . QUEtiapine  50 mg Oral QHS  . thiamine  100 mg Intravenous Daily   Continuous Infusions:  PRN Meds:.RESOURCE THICKENUP CLEAR Medications Prior to Admission:  Prior to Admission medications   Medication Sig Start Date End Date Taking? Authorizing Provider  latanoprost (XALATAN) 0.005 % ophthalmic solution Place 1 drop into both eyes at bedtime.   Yes Historical Provider, MD   No Known Allergies CBC:  Component Value Date/Time   WBC 8.8 05/07/2015 0750   HGB 8.0* 05/07/2015 0750   HCT 24.0* 05/07/2015 0750   PLT 337 05/07/2015 0750   MCV 94.9 05/07/2015 0750   NEUTROABS 5.6 04/29/2015 0218   LYMPHSABS 0.7 04/29/2015 0218   MONOABS 0.7 04/29/2015 0218   EOSABS 0.1 04/29/2015 0218   BASOSABS 0.0 04/29/2015 0218   Comprehensive Metabolic Panel:    Component Value Date/Time   NA 143 05/07/2015 0750   K 3.0* 05/07/2015 0750   CL 112* 05/07/2015 0750   CO2 25 05/07/2015 0750   BUN <5* 05/07/2015 0750   CREATININE 1.12 05/07/2015 0750   GLUCOSE 38* 05/07/2015 0750   CALCIUM 8.1* 05/07/2015 0750   AST 35 04/30/2015 0235   ALT 27 04/30/2015 0235   ALKPHOS 55 04/30/2015 0235   BILITOT 0.7 04/30/2015 0235   PROT 4.7* 04/30/2015 0235   ALBUMIN 1.8* 04/30/2015 0235    Physical Exam: Vital Signs: BP 132/62 mmHg  Pulse 82  Temp(Src) 98.1 F (36.7 C) (Axillary)  Resp 17  Ht 5\' 11"  (1.803 m)  Wt 65 kg (143 lb 4.8 oz)  BMI 20.00 kg/m2  SpO2 97% SpO2: SpO2: 97 % O2 Device: O2 Device: Not Delivered O2 Flow Rate:   Intake/output summary:  Intake/Output Summary (Last 24 hours) at 05/07/15 1033 Last data filed at 05/07/15 0600  Gross per 24 hour  Intake      0 ml  Output      0 ml  Net      0 ml   LBM:  Last BM Date: 05/05/15 Baseline Weight: Weight: 54.432 kg (120 lb) Most recent weight: Weight: 65 kg (143 lb 4.8 oz)  Exam Findings:  Constitutional: Resp: Even and non labored GI: abd soft, no grimace with palpation.           Palliative Performance Scale:          3 months ago: 70% 1 month ago:  60% Today: 30%   Additional Data Reviewed: Recent Labs     05/06/15  0536  05/07/15  0750  WBC  7.9  8.8  HGB  7.6*  8.0*  PLT  298  337  NA  142  143  BUN  <5*  <5*  CREATININE  1.03  1.12     Time In: 1030 Time Out: 1100 Time Total:  90 minutes Greater than 50%  of this time was spent counseling and coordinating care related to the above assessment and plan. Plan of care discussed with Dr. Janee Morn  Signed by: Katheran Awe, NP  Katheran Awe, NP  05/07/2015, 10:33 AM  Please contact Palliative Medicine Team phone at [phone] for questions and concerns.

## 2015-05-07 NOTE — Progress Notes (Signed)
Speech Language Pathology Treatment: Dysphagia  Patient Details Name: Greg Scott MRN: [id number] DOB: [birth year] Today's Date: 05/07/2015 Time: [id number] SLP Time Calculation (min) (ACUTE ONLY): 35 min  Assessment / Plan / Recommendation Clinical Impression  Pt seen with lunch meal. Pt required max assist to consume PO and benefit from very careful, patient feeding. To clarify, pt is not refusing PO; he is severely cognitively impaired due to dementia. He is poorly aware of his surroundings and is anxious with unexpected touch or when restrained. He benefits from simple, polite explanations to know what to expect. "Greg Scott here is another bite of your pudding." SLP found that by removing restraints and letting pt hold cup of spoon with assist, over several attempts, he was able to begin automatically self feeding and initiating sips and bites. Pt orally holds boluses, but will initiate swallow if presented with another bite or sip. He consumed 100% of his magic cup and 50% of his ensure. Pt was given nectar thick liquids with 2-3 throat clears, improved timing since last session. Will upgrade liquids to nectar thick. Provided instruction to NT who had already been using come hand over hand techniques. Agree with consideration of palliative care as pts cognition is severely impaired and staff will struggle to meet his needs nutritionally. Likely pts ability to consume PO will continue to decline.    HPI Other Pertinent Information: Greg Scott is a 81 y.o. BM PMHx glaucoma/ blindness, lives at home with family. Has been doing more poorly over the last few months in terms of mobility. Pt's PCP thought that he might have a "pinched nerve" in his back causing bilat LE weakness and frequent falls that the pt was having. In the midst of these issues, the son went away for the weekend and a caretaker was supposed to look after Mr Greg Scott. On returning they found the pt on the floor with dried feces/ urine, poorly  responsive to brought to ED.    Pertinent Vitals    SLP Plan  Continue with current plan of care    Recommendations Diet recommendations: Dysphagia 1 (puree);Nectar-thick liquid Liquids provided via: Cup Medication Administration: Crushed with puree Supervision: Staff to assist with self feeding;Full supervision/cueing for compensatory strategies Compensations: Slow rate;Small sips/bites;Externally pace Postural Changes and/or Swallow Maneuvers: Seated upright 90 degrees;Upright 30-60 min after meal              Oral Care Recommendations: Oral care BID Follow up Recommendations: Skilled Nursing facility Plan: Continue with current plan of care    GO    Sierra Surgery HospitalBonnie Chakia Counts, MA CCC-SLP [phone]  Claudine MoutonDeBlois, Muhamed Luecke Caroline 05/07/2015, 2:35 PM

## 2015-05-07 NOTE — Progress Notes (Signed)
[facility] PROGRESS NOTE  Greg Scott ZOX:[id number] DOB: 10/21/1922 DOA: 04/22/2015 PCP: No primary care provider on file.  Admit HPI / Brief Narrative: Greg Scott is a 81 y.o. BM PMHx glaucoma/ blindness, lives at home with family. Has been doing more poorly over the last few months in terms of mobility. Pt's PCP thought that he might have a "pinched nerve" in his back causing bilat LE weakness and frequent falls that the pt was having. In the midst of these issues, the son went away for the weekend and a caretaker was supposed to look after Mr Greg Scott. On returning they found the pt on the floor with dried feces/ urine, poorly responsive to brought to ED.   In ED pt was hypothermic, BP 127/86, HR 88, temp 95.2. He looked dehydrated and has rec'd about 4L IVF's so far. Na 146 CO2 23 BUN 35 Creat 1.31. Alb 2.5, Ca 8.1, total bili 1.7, NH3 16. Lactic acid 2.1, WBC 8k, Hb 9.9, Plt 151 Trop 0.04 UA large small LE/ pos nitrite/ 3-6 wbc's CT head showed some small WM changes and moderate cortical volume loss, no acute CVA, no skull fracture. EEG done was negative. MRI/MRA of the head done was negative.  Patient now with confusion and hallucinations likely secondary to dementia and history of alcohol use. Patient refusing oral intake. Patient refusing medications. Patient has been placed on appetite stimulant. Psychiatry has assessed the patient and patient was started on low-dose Seroquel which has been increased to 50 mg daily. Palliative care has also been consulted and following along.  Assessment/Plan: Syncope and collapse -Likely multifactorial to include UTI, alcohol abuse (possible seizure); no evidence of stroke or MI -Echocardiogram; see results below -Troponin 3 mildly positive -Brain MRI/MRA negative for acute infarct or masses. -TSH within normal limit -Continue Sensitive SSI -EEG; normal  Alcohol abuse -Alcohol level negative - perhaps patient's presentation was due to  acute alcohol withdrawal -urine drug screen negative  -Discontinued CIWA protocol, and all narcotics/sedating medication -Passed swallow study. Placed back on D5 normal saline as patient was noted to have some hypoglycemic spells and refusing oral intake.    Acute Encephalopathy/AMS  questionable etiology. Patient with confusion with some hallucinations. May be secondary to a worsening dementia. Patient with history of significant alcohol abuse. -See syncope and fall. -Discontinued all benzodiazepine and narcotics - Patient with bouts of confusion and hallucination. Patient may have a component of dementia. CT head  Negative for any acute abnormalities with moderate cortical volume loss and scattered small vessel ischemic microangiopathy. MRI of the head with moderate atrophy negative for acute infarct or mass. Folate levels at 16.1. B-12 levels at 397. B-12 levels are borderline will give a trial of vitamin B-12 1000 mcg Greg Scott weekly 3 weeks.  Patient has been seen in consultation by psychiatry and patient has been started on low-dose Seroquel for hallucinations. Increased Seroquel to 50 mg daily at bedtime yesterday. Per psychiatry patient with no capacity. Patient will likely need SNF with 24-hour care on discharge.  Psychiatry following.  -Swallow study; passed; dysphagia 1 honey thick  Hypoglycemia Patient noted to have a hypoglycemic spell this morning. Patient also received some sliding scale insulin this morning. Patient's hypoglycemia likely secondary to decreased oral intake as patient is refusing to eat and refusing medications. Will discontinue sliding scale insulin. Place on D5 normal saline. Follow.  Urinary tract infection with hematuria -Completed 5 day course antibiotics   iron deficiency Anemia May be dilutional in nature.  Patient with no overt bleeding.  Anemia panel with iron level of 21. Patient has received IV iron. Patient also on oral iron supplementation. Hemoglobin  currently stable at 7.6. Follow H&H. Transfusion threshold hemoglobin less than 7.  Rib fracture -Remote right fifth and sixth rib fractures; monitor for now  Hypokalemia/Hyperkalemia -Potassium goal> 4 -Kayexalate 30 gm 1 for hyperkalemia.  Hypomagnesemia  -Replete. Keep magnesium goal > 2   Stage II sacral decubitus Continue frequent turns.  Prophylaxis Heparin for DVT prophylaxis.  Failure to thrive/protein calorie malnutrition Patient with failure to thrive patient refusing oral intake. Patient with confusion and hallucinations. Patient with CT head and MRI of the head negative for acute abnormalities have a does have moderate atrophy and patient likely does have underlying dementia. Patient refusing oral medications. Patient refusing to eat. Continue Marinol tablets to see if his diet improve. Palliative care has been consulted for goals of care and further evaluation and management.     Code Status: Full Family Communication: Updated son via telephone.  Disposition Plan: SNF. Pending palliative care consultation.   Consultants:  Psychiatry:  Dr. Elsie Saas 05/04/2015  Palliative care: Dr. Linna Darner 1 05/07/2015  Procedures: 7/4 CT head without contrast;No acute intracranial pathology -Moderate cortical volume loss and scattered small vessel ischemic microangiopathy. 7/5 MRI brain without contrast; -moderate atrophy. -Negative for acute infarct or mass.  7/6 echocardiogram;- LVEF= 55%- 60%. Hypokinesis of the mid-apicalanteroseptal myocardium. 7/8 EEG; normal  Culture 7/4 blood left arm/hand NGTD 7/4 urine negative 7/5 MRSA by PCR negative   Antibiotics:  IV Zosyn 04/22/15 >>> 04/24/15  IV vancomycin 04/22/2015>>>>> 04/24/2015  IV Rocephin 04/24/2015>>>>>> 04/28/2015  HPI/Subjective: Patient  Alert. Patient in bed. Patient confused. Patient hallucinating. Patient refusing oral medications. Patient with poor oral intake.   Objective: Filed Vitals:    05/07/15 1605  BP: 152/112  Pulse: 94  Temp: 98.3 F (36.8 C)  Resp: 18    Intake/Output Summary (Last 24 hours) at 05/07/15 1641 Last data filed at 05/07/15 1424  Gross per 24 hour  Intake    200 ml  Output      0 ml  Net    200 ml   Filed Weights   04/23/15 0330 05/02/15 0322 05/03/15 2017  Weight: 56.4 kg (124 lb 5.4 oz) 63.3 kg (139 lb 8.8 oz) 65 kg (143 lb 4.8 oz)    Exam:   General:  NAD. Mittens on  Cardiovascular: RRR  Respiratory: CTAB  Abdomen: Soft/NT/ND/+BS  Musculoskeletal: No c/c/e  Data Reviewed: Basic Metabolic Panel:  Recent Labs Lab 05/01/15 0930 05/02/15 0612  05/03/15 0359 05/04/15 0428 05/05/15 0421 05/06/15 0536 05/07/15 0750  NA 142 141  --  139 141 140 142 143  K 3.3* 2.9*  < > 3.4* 3.5 3.6 3.6 3.0*  CL 114* 109  --  110 111 111 113* 112*  CO2 23 24  --  GLUCOSE 109* 94  --  93 109* 75 90 38*  BUN 5* <5*  --  <5* <5* 5* <5* <5*  CREATININE 0.98 1.06  --  1.08 1.13 1.14 1.03 1.12  CALCIUM 7.8* 8.0*  --  7.5* 7.5* 7.7* 7.5* 8.1*  MG 1.8 1.7  --   --   --  1.7  --   --   < > = values in this interval not displayed. Liver Function Tests: No results for input(s): AST, ALT, ALKPHOS, BILITOT, PROT, ALBUMIN in the last 168 hours. No results for  input(s): LIPASE, AMYLASE in the last 168 hours. No results for input(s): AMMONIA in the last 168 hours. CBC:  Recent Labs Lab 05/03/15 0359 05/04/15 0428 05/05/15 0421 05/06/15 0536 05/07/15 0750  WBC 7.0 8.2 7.6 7.9 8.8  HGB 7.9* 7.6* 7.6* 7.6* 8.0*  HCT 23.4* 22.2* 22.9* 22.2* 24.0*  MCV 95.1 96.9 95.4 93.7 94.9  PLT 288 304 329 298 337   Cardiac Enzymes: No results for input(s): CKTOTAL, CKMB, CKMBINDEX, TROPONINI in the last 168 hours. BNP (last 3 results) No results for input(s): BNP in the last 8760 hours.  ProBNP (last 3 results) No results for input(s): PROBNP in the last 8760 hours.  CBG:  Recent Labs Lab 05/07/15 0806 05/07/15 0845 05/07/15 1202  05/07/15 1343 05/07/15 1556  GLUCAP 38* 89 37* 69 141*    Recent Results (from the past 240 hour(s))  Clostridium Difficile by PCR (not at [facility])     Status: None   Collection Time: 05/01/15  5:10 PM  Result Value Ref Range Status   C difficile by pcr NEGATIVE NEGATIVE Final     Studies: No results found.  Scheduled Meds: . antiseptic oral rinse  7 mL Mouth Rinse q12n4p  . chlorhexidine  15 mL Mouth Rinse BID  . cyanocobalamin  1,000 mcg Intramuscular Weekly  . docusate sodium  100 mg Oral BID  . dronabinol  2.5 mg Oral BID AC  . enoxaparin (LOVENOX) injection  30 mg Subcutaneous Q24H  . feeding supplement (ENSURE ENLIVE)  237 mL Oral TID BM  . folic acid  1 mg Intravenous Daily  . iron polysaccharides  150 mg Oral Daily  . latanoprost  1 drop Both Eyes QHS  . QUEtiapine  50 mg Oral QHS  . thiamine  100 mg Intravenous Daily   Continuous Infusions: . dextrose 5 % and 0.9% NaCl 75 mL/hr at 05/07/15 1255    Principal Problem:   Altered mental status Active Problems:   Volume depletion   Failure to thrive in adult   Blind   Glaucoma   Pyuria   Syncope and collapse   Alcohol abuse   Acute cystitis with hematuria   Fractured rib   Hypokalemia   Pressure ulcer   UTI (lower urinary tract infection)   Hypomagnesemia   Acute encephalopathy   Urinary tract infectious disease   Rib fracture   Encephalopathy acute   Hyperkalemia   Protein-calorie malnutrition, severe   Anemia, iron deficiency   Palliative care encounter   DNR (do not resuscitate) discussion    Time spent: 35 minutes    THOMPSON,DANIEL M.D. [facility] Pager [phone]. If 7PM-7AM, please contact night-coverage at [email or web address], password [facility] 05/07/2015, 4:41 PM  LOS: 14 days

## 2015-05-07 NOTE — Progress Notes (Signed)
Writer contacted Dr Janee Mornhompson regarding patient's repetitive hypoglycemic events, Dr. Janee Mornhompson d/c'd the sliding scale coverage and placed  the patient on D5 NS at 6775ml/hr. Will continue to check blood sugars and tret as indicated.

## 2015-05-07 NOTE — Progress Notes (Signed)
Hypoglycemic Event  CBG: 38  Treatment: D50 IV 50 mL  Symptoms: lethargic  Follow-up CBG: Time:0830 CBG Result:89  Possible Reasons for Event: Inadequate meal intake  Comments/MD notified:no    Kieth BrightlyKnisley, Arlyne Brandes E  Remember to initiate Hypoglycemia Order Set & complete

## 2015-05-08 LAB — CBC
HCT: 21.9 % — ABNORMAL LOW (ref 39.0–52.0)
HEMOGLOBIN: 7.4 g/dL — AB (ref 13.0–17.0)
MCH: 32 pg (ref 26.0–34.0)
MCHC: 33.8 g/dL (ref 30.0–36.0)
MCV: 94.8 fL (ref 78.0–100.0)
Platelets: 315 10*3/uL (ref 150–400)
RBC: 2.31 MIL/uL — AB (ref 4.22–5.81)
RDW: 13.9 % (ref 11.5–15.5)
WBC: 5.9 10*3/uL (ref 4.0–10.5)

## 2015-05-08 LAB — GLUCOSE, CAPILLARY
GLUCOSE-CAPILLARY: 138 mg/dL — AB (ref 65–99)
GLUCOSE-CAPILLARY: 143 mg/dL — AB (ref 65–99)
GLUCOSE-CAPILLARY: 66 mg/dL (ref 65–99)
Glucose-Capillary: 106 mg/dL — ABNORMAL HIGH (ref 65–99)
Glucose-Capillary: 111 mg/dL — ABNORMAL HIGH (ref 65–99)
Glucose-Capillary: 118 mg/dL — ABNORMAL HIGH (ref 65–99)
Glucose-Capillary: 146 mg/dL — ABNORMAL HIGH (ref 65–99)
Glucose-Capillary: 73 mg/dL (ref 65–99)

## 2015-05-08 LAB — MAGNESIUM: Magnesium: 1.6 mg/dL — ABNORMAL LOW (ref 1.7–2.4)

## 2015-05-08 LAB — COMPREHENSIVE METABOLIC PANEL
ALBUMIN: 1.8 g/dL — AB (ref 3.5–5.0)
ALT: 20 U/L (ref 17–63)
AST: 27 U/L (ref 15–41)
Alkaline Phosphatase: 67 U/L (ref 38–126)
Anion gap: 6 (ref 5–15)
BUN: <5 mg/dL — ABNORMAL LOW (ref 6–20)
CALCIUM: 7.6 mg/dL — AB (ref 8.9–10.3)
CO2: 24 mmol/L (ref 22–32)
Chloride: 110 mmol/L (ref 101–111)
Creatinine, Ser: 1.02 mg/dL (ref 0.61–1.24)
GFR calc Af Amer: >60 mL/min (ref >60)
Glucose, Bld: 143 mg/dL — ABNORMAL HIGH (ref 65–99)
POTASSIUM: 3.1 mmol/L — AB (ref 3.5–5.1)
SODIUM: 140 mmol/L (ref 135–145)
Total Bilirubin: 1.4 mg/dL — ABNORMAL HIGH (ref 0.3–1.2)
Total Protein: 5.1 g/dL — ABNORMAL LOW (ref 6.5–8.1)

## 2015-05-08 MED ORDER — POTASSIUM CHLORIDE 10 MEQ/100ML IV SOLN
10.0000 meq | INTRAVENOUS | Status: AC
  Administered 2015-05-08 (×3): 10 meq via INTRAVENOUS
  Filled 2015-05-08 (×3): qty 100

## 2015-05-08 MED ORDER — DEXTROSE 50 % IV SOLN
INTRAVENOUS | Status: AC
  Administered 2015-05-08: 50 mL
  Filled 2015-05-08: qty 50

## 2015-05-08 NOTE — Consult Note (Signed)
[facility] Consult follow-up  Reason for Consult:  Confusion and alcohol abuse Referring Physician:  Dr. Grandville Silos Patient Identification: Greg Scott MRN:  [id number] Principal Diagnosis: Altered mental status Diagnosis:   Patient Active Problem List   Diagnosis Date Noted  . Palliative care encounter [Z51.5] 05/07/2015  . DNR (do not resuscitate) discussion [Z71.89]   . Hypoglycemia [E16.2]   . Anemia, iron deficiency [D50.9]   . Protein-calorie malnutrition, severe [E43] 05/02/2015  . Encephalopathy acute [G93.40]   . Hyperkalemia [E87.5]   . Acute encephalopathy [G93.40]   . Urinary tract infectious disease [N39.0]   . Rib fracture [S22.39XA]   . UTI (lower urinary tract infection) [N39.0]   . Hypomagnesemia [E83.42]   . Pressure ulcer [L89.90] 04/24/2015  . Volume depletion [E86.9] 04/23/2015  . Altered mental status [R41.82] 04/23/2015  . Failure to thrive in adult [R62.7] 04/23/2015  . Blind [H54.0] 04/23/2015  . Glaucoma [H40.9] 04/23/2015  . Pyuria [N39.0] 04/23/2015  . AKI (acute kidney injury) [N17.9]   . Hypothermia [T68.XXXA]   . Syncope and collapse [R55]   . Alcohol abuse [F10.10]   . Acute cystitis with hematuria [N30.01]   . Fractured rib [S22.39XA]   . Hypokalemia [E87.6]     Total Time spent with patient: 30 minutes  Subjective:   Greg Scott is a 81 y.o. male patient admitted with confusion.  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 Patient has history of drinking since he has been retired and has been slowly deteriorated his mental and physical condition. He needs 24 hours care and bed ridden at this time. Patient is poor historian, verbal responses are unpredictable and does not makes sense when he responds.  Reportedly his son believes that he has been depressed over three months and base line is able to communicate appropriately and at times can feed himself. He has recent one month history of syncopal episodes. He appeared lying down on his bed awake, not alert and oriented to place, person, time and situation. He does talk spontaneously and observed talking to himself even no one in his room. Patient has no history of substance abuse or psychiatric treatment as out patient or inpatient.   Interval history: Patient seen face-to-face for the psychiatric consultation follow-up. Patient appeared somewhat calm, cooperative and responding to the verbal stimuli. Patient also appeared talking to himself and talking with people who were not there. Patient stated he cannot see because of blindness secondary to glaucoma. Patient seems to be bedridden and unable to mobile at this time. Patient has been compliant with his medication management which is Seroquel 50 mg at bedtime and seems to be resting well and somewhat positively responded. Patient has no extrapyramidal symptoms.   Past Medical History:  Past Medical History  Diagnosis Date  . Glaucoma   . Blind    History reviewed. No pertinent past surgical history. Family History: History reviewed. No pertinent family history. Social History:  History  Alcohol Use: Not on file     History  Drug Use Not on file    History   Social History  . Marital Status: Married    Spouse Name: N/A  . Number of Children: N/A  . Years of Education: N/A   Social History Main Topics  .  Smoking status: Never Smoker   . Smokeless tobacco: Not on file  . Alcohol Use: Not on file  . Drug Use: Not on file  . Sexual Activity: Not on file   Other Topics Concern  . None   Social History Narrative   Additional Social History:                          Allergies:  No Known Allergies  Labs:  Results for orders placed or performed during the hospital  encounter of 04/22/15 (from the past 48 hour(s))  Glucose, capillary     Status: Abnormal   Collection Time: 05/06/15 11:46 AM  Result Value Ref Range   Glucose-Capillary 109 (H) 65 - 99 mg/dL   Comment 1 Notify RN    Comment 2 Document in Chart   Glucose, capillary     Status: None   Collection Time: 05/06/15  4:19 PM  Result Value Ref Range   Glucose-Capillary 79 65 - 99 mg/dL  Glucose, capillary     Status: None   Collection Time: 05/06/15  8:11 PM  Result Value Ref Range   Glucose-Capillary 67 65 - 99 mg/dL  Glucose, capillary     Status: Abnormal   Collection Time: 05/06/15  8:50 PM  Result Value Ref Range   Glucose-Capillary 209 (H) 65 - 99 mg/dL  Glucose, capillary     Status: None   Collection Time: 05/07/15 12:05 AM  Result Value Ref Range   Glucose-Capillary 65 65 - 99 mg/dL  Glucose, capillary     Status: Abnormal   Collection Time: 05/07/15  1:12 AM  Result Value Ref Range   Glucose-Capillary 169 (H) 65 - 99 mg/dL  Glucose, capillary     Status: Abnormal   Collection Time: 05/07/15  4:51 AM  Result Value Ref Range   Glucose-Capillary 125 (H) 65 - 99 mg/dL  CBC     Status: Abnormal   Collection Time: 05/07/15  7:50 AM  Result Value Ref Range   WBC 8.8 4.0 - 10.5 K/uL   RBC 2.53 (L) 4.22 - 5.81 MIL/uL   Hemoglobin 8.0 (L) 13.0 - 17.0 g/dL   HCT 24.0 (L) 39.0 - 52.0 %   MCV 94.9 78.0 - 100.0 fL   MCH 31.6 26.0 - 34.0 pg   MCHC 33.3 30.0 - 36.0 g/dL   RDW 13.9 11.5 - 15.5 %   Platelets 337 150 - 400 K/uL  Basic metabolic panel     Status: Abnormal   Collection Time: 05/07/15  7:50 AM  Result Value Ref Range   Sodium 143 135 - 145 mmol/L   Potassium 3.0 (L) 3.5 - 5.1 mmol/L   Chloride 112 (H) 101 - 111 mmol/L   CO2 25 22 - 32 mmol/L   Glucose, Bld 38 (LL) 65 - 99 mg/dL    Comment: REPEATED TO VERIFY CRITICAL RESULT CALLED TO, READ BACK BY AND VERIFIED WITH: E.KNISLEY,RN 0901 05/07/15 CLARK,S    BUN <5 (L) 6 - 20 mg/dL   Creatinine, Ser 1.12 0.61 - 1.24  mg/dL   Calcium 8.1 (L) 8.9 - 10.3 mg/dL   GFR calc non Af Amer 56 (L) >60 mL/min   GFR calc Af Amer >60 >60 mL/min    Comment: (NOTE) The eGFR has been calculated using the CKD EPI equation. This calculation has not been validated in all clinical situations. eGFR's persistently <60 mL/min signify possible Chronic Kidney Disease.    Anion  gap 6 5 - 15  Glucose, capillary     Status: Abnormal   Collection Time: 05/07/15  8:04 AM  Result Value Ref Range   Glucose-Capillary 34 (LL) 65 - 99 mg/dL   Comment 1 Notify RN   Glucose, capillary     Status: Abnormal   Collection Time: 05/07/15  8:06 AM  Result Value Ref Range   Glucose-Capillary 38 (LL) 65 - 99 mg/dL   Comment 1 Notify RN   Glucose, capillary     Status: None   Collection Time: 05/07/15  8:45 AM  Result Value Ref Range   Glucose-Capillary 89 65 - 99 mg/dL  Glucose, capillary     Status: Abnormal   Collection Time: 05/07/15 12:02 PM  Result Value Ref Range   Glucose-Capillary 37 (LL) 65 - 99 mg/dL   Comment 1 Notify RN   Glucose, capillary     Status: None   Collection Time: 05/07/15  1:43 PM  Result Value Ref Range   Glucose-Capillary 69 65 - 99 mg/dL  Glucose, capillary     Status: Abnormal   Collection Time: 05/07/15  3:56 PM  Result Value Ref Range   Glucose-Capillary 141 (H) 65 - 99 mg/dL  Glucose, capillary     Status: Abnormal   Collection Time: 05/07/15  8:22 PM  Result Value Ref Range   Glucose-Capillary 154 (H) 65 - 99 mg/dL  Glucose, capillary     Status: Abnormal   Collection Time: 05/08/15 12:18 AM  Result Value Ref Range   Glucose-Capillary 138 (H) 65 - 99 mg/dL  CBC     Status: Abnormal   Collection Time: 05/08/15  4:17 AM  Result Value Ref Range   WBC 5.9 4.0 - 10.5 K/uL   RBC 2.31 (L) 4.22 - 5.81 MIL/uL   Hemoglobin 7.4 (L) 13.0 - 17.0 g/dL   HCT 21.9 (L) 39.0 - 52.0 %   MCV 94.8 78.0 - 100.0 fL   MCH 32.0 26.0 - 34.0 pg   MCHC 33.8 30.0 - 36.0 g/dL   RDW 13.9 11.5 - 15.5 %   Platelets  315 150 - 400 K/uL  Comprehensive metabolic panel     Status: Abnormal   Collection Time: 05/08/15  4:17 AM  Result Value Ref Range   Sodium 140 135 - 145 mmol/L   Potassium 3.1 (L) 3.5 - 5.1 mmol/L   Chloride 110 101 - 111 mmol/L   CO2 24 22 - 32 mmol/L   Glucose, Bld 143 (H) 65 - 99 mg/dL   BUN <5 (L) 6 - 20 mg/dL   Creatinine, Ser 1.02 0.61 - 1.24 mg/dL   Calcium 7.6 (L) 8.9 - 10.3 mg/dL   Total Protein 5.1 (L) 6.5 - 8.1 g/dL   Albumin 1.8 (L) 3.5 - 5.0 g/dL   AST 27 15 - 41 U/L   ALT 20 17 - 63 U/L   Alkaline Phosphatase 67 38 - 126 U/L   Total Bilirubin 1.4 (H) 0.3 - 1.2 mg/dL   GFR calc non Af Amer >60 >60 mL/min   GFR calc Af Amer >60 >60 mL/min    Comment: (NOTE) The eGFR has been calculated using the CKD EPI equation. This calculation has not been validated in all clinical situations. eGFR's persistently <60 mL/min signify possible Chronic Kidney Disease.    Anion gap 6 5 - 15  Magnesium     Status: Abnormal   Collection Time: 05/08/15  4:17 AM  Result Value Ref Range   Magnesium  1.6 (L) 1.7 - 2.4 mg/dL  Glucose, capillary     Status: Abnormal   Collection Time: 05/08/15  4:33 AM  Result Value Ref Range   Glucose-Capillary 118 (H) 65 - 99 mg/dL  Glucose, capillary     Status: Abnormal   Collection Time: 05/08/15  7:29 AM  Result Value Ref Range   Glucose-Capillary 111 (H) 65 - 99 mg/dL    Vitals: Blood pressure 131/76, pulse 96, temperature 98.6 F (37 C), temperature source Oral, resp. rate 17, height 5' 11"  (1.803 m), weight 65 kg (143 lb 4.8 oz), SpO2 100 %.  Risk to Self: Is patient at risk for suicide?: No Risk to Others:   Prior Inpatient Therapy:   Prior Outpatient Therapy:    Current Facility-Administered Medications  Medication Dose Route Frequency Provider Last Rate Last Dose  . antiseptic oral rinse (CPC / CETYLPYRIDINIUM CHLORIDE 0.05%) solution 7 mL  7 mL Mouth Rinse q12n4p Roney Jaffe, MD   7 mL at 05/07/15 1220  . chlorhexidine (PERIDEX)  0.12 % solution 15 mL  15 mL Mouth Rinse BID Roney Jaffe, MD   15 mL at 05/06/15 0857  . cyanocobalamin ((VITAMIN B-12)) injection 1,000 mcg  1,000 mcg Intramuscular Weekly Eugenie Filler, MD   1,000 mcg at 05/05/15 1323  . dextrose 5 %-0.9 % sodium chloride infusion   Intravenous Continuous Eugenie Filler, MD 75 mL/hr at 05/08/15 0148    . docusate sodium (COLACE) capsule 100 mg  100 mg Oral BID Eugenie Filler, MD   100 mg at 05/07/15 2257  . dronabinol (MARINOL) capsule 2.5 mg  2.5 mg Oral BID AC Eugenie Filler, MD   2.5 mg at 05/06/15 1700  . enoxaparin (LOVENOX) injection 30 mg  30 mg Subcutaneous Q24H Allie Bossier, MD   30 mg at 05/07/15 1256  . feeding supplement (ENSURE ENLIVE) (ENSURE ENLIVE) liquid 237 mL  237 mL Oral TID BM Dale [place], RD   237 mL at 05/07/15 1341  . folic acid injection 1 mg  1 mg Intravenous Daily Theone Murdoch Hammons, RPH   1 mg at 05/06/15 1203  . iron polysaccharides (NIFEREX) capsule 150 mg  150 mg Oral Daily Eugenie Filler, MD   150 mg at 05/05/15 1000  . latanoprost (XALATAN) 0.005 % ophthalmic solution 1 drop  1 drop Both Eyes QHS Cherene Altes, MD   1 drop at 05/07/15 2251  . QUEtiapine (SEROQUEL) tablet 50 mg  50 mg Oral QHS Eugenie Filler, MD   50 mg at 05/06/15 2148  . RESOURCE THICKENUP CLEAR   Oral PRN Allie Bossier, MD      . thiamine (B-1) injection 100 mg  100 mg Intravenous Daily Theone Murdoch Hammons, RPH   100 mg at 05/06/15 1204    Musculoskeletal: Strength & Muscle Tone: decreased Gait & Station: unable to stand Patient leans: N/A  Psychiatric Specialty Exam: Physical Exam   ROS   Blood pressure 131/76, pulse 96, temperature 98.6 F (37 C), temperature source Oral, resp. rate 17, height 5' 11"  (1.803 m), weight 65 kg (143 lb 4.8 oz), SpO2 100 %.Body mass index is 20 kg/(m^2).  General Appearance: Disheveled and Guarded  Eye Contact::  Minimal  Speech:  Clear and Coherent  Volume:  Normal  Mood:  Anxious   Affect:  Depressed and Flat  Thought Process:  Coherent and Goal Directed  Orientation:  Negative  Thought Content:  Hallucinations: Visual  Suicidal Thoughts:  No  Homicidal Thoughts:  No  Memory:  Immediate;   Poor Recent;   Poor  Judgement:  Poor  Insight:  Negative  Psychomotor Activity:  Decreased  Concentration:  Fair  Recall:  [employer] of Knowledge:Poor  Language: Fair  Akathisia:  Negative  Handed:  Right  AIMS (if indicated):     Assets:  Catering manager Housing Leisure Time Social Support Transportation  ADL's:  Impaired  Cognition: Impaired,  Severe  Sleep:      Medical Decision Making: Review of Psycho-Social Stressors (1), Review or order clinical lab tests (1), Established Problem, Worsening (2), Review of Last Therapy Session (1), Review or order medicine tests (1), Review of Medication Regimen & Side Effects (2) and Review of New Medication or Change in Dosage (2)  Treatment Plan Summary: Patient  with visual hallucinations and blindness due to glaucoma, withdrawn, depression, history of alcohol abuse and multiple medical issues. Patient slowly responding his current medication management and has no reported agitation and less hallucinations during this visit.   Daily contact with patient to assess and evaluate symptoms and progress in treatment and Medication management  Plan: Patient has no capacity to make medical decisions or living arrangements Continue Seroquel 50 mg at bed time for hallucinations Patient does not meet criteria for psychiatric inpatient admission. Supportive therapy provided about ongoing stressors.  Appreciate psychiatric consultation Please contact [phone] or [phone] if needs further assistance   Disposition: Patient benefit from out of home placement - SNF as he needs 24 hours care when discharged from hospital.   [facility] R. 05/08/2015 10:06 AM

## 2015-05-08 NOTE — Progress Notes (Signed)
Nutrition Follow-up  DOCUMENTATION CODES:   Severe malnutrition in context of chronic illness  INTERVENTION:   Continue Ensure Enlive po TID (thickened to nectar thick), each supplement provides 350 kcal and 20 grams of protein.  Provide Magic cup TID with meals, each supplement provides 290 kcal and 9 grams of protein.  May need consideration of enteral nutrition (pending pt/family decision) as pt with very poor PO intake. Recommend Jevity 1.2 via tube at 25 ml/hr and increasing by 10 ml every 8 hours to goal rate of 65 ml/hr to provide 1872 kcal, 87 grams of protein, and 1264 ml of free water.  NUTRITION DIAGNOSIS:   Inadequate oral intake related to dysphagia as evidenced by meal completion < 25%; ongoing  GOAL:   Patient will meet greater than or equal to 90% of their needs; not met  MONITOR:   PO intake, Supplement acceptance, Labs, Weight trends  REASON FOR ASSESSMENT:   Consult Enteral/tube feeding initiation and management  ASSESSMENT:   81 y.o. Male with hx glaucoma/ blindness, lives at home with family. Has been doing more poorly over the last few months in terms of mobility. Pt's PCP thought that he might have a "pinched nerve" in his back causing bilat LE weakness and frequent falls that the pt was having. In the midst of these issues, the son went away for the weekend and a caretaker was supposed to look after Mr Binns. On returning they found the pt on the floor with dried feces/ urine, poorly responsive to brought to ED.   Meal completion has been 0-10%. Pt has been consuming his Ensure only intermittently. Pt was been refusing her POs at times. Pt remains full code per palliative note, recommend consideration of starting enteral nutrition. Recommendations has been stated above. RD to continue to monitor.   Diet Order:  DIET - DYS 1 Room service appropriate?: Yes; Fluid consistency:: Nectar Thick  Skin:  Wound (see comment) (stage 1 to sacrum, DTI to foot) Stage  II on buttocks, stage I on L hip, +1 RLE, +2 LLE edema  Last BM:  7/17  Height:   Ht Readings from Last 1 Encounters:  04/23/15 _0  (1.803 m)    Weight:   Wt Readings from Last 1 Encounters:  05/03/15 143 lb 4.8 oz (65 kg)    Ideal Body Weight:  78.1 kg  Wt Readings from Last 10 Encounters:  05/03/15 143 lb 4.8 oz (65 kg)    BMI:  Body mass index is 20 kg/(m^2).  Estimated Nutritional Needs:   Kcal:  1700-1900  Protein:  85-100 grams  Fluid:  1.7-1.9 L/day  EDUCATION NEEDS:   Education needs no appropriate at this time  Corrin Parker, MS, RD, LDN Pager # [phone] After hours/ weekend pager # [phone]

## 2015-05-08 NOTE — Progress Notes (Signed)
Physical Therapy Treatment Patient Details Name: Greg Scott MRN: [id number] DOB: 1923-01-24 Today's Date: 05/08/2015    History of Present Illness Patient is an 81 yo male admitted 04/22/15 after being found on floor.  Patient with AMS, UTI, BLE weakness, sacral decubitus.  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 ability to participate in PT and mobility activities waxes and wanes; Sitting EOB, he had a significant posterior lean/push, making it unsafe t transfer OOB via pivot; He did have a brief moment of calm EOB as we discussed relaxation practice (he is a retired Facilities manager, Animal nutritionist, and theater professor)  Follow Up Recommendations  SNF;Supervision/Assistance - 24 hour     Equipment Recommendations  Wheelchair (measurements PT);Wheelchair cushion (measurements PT) (and continuing to be determined)    Recommendations for Other Services       Precautions / Restrictions Precautions Precautions: Fall Restrictions Other Position/Activity Restrictions: Lt foot painful to touch    Mobility  Bed Mobility Overal bed mobility: Needs Assistance;+2 for physical assistance Bed Mobility: Supine to Sit;Sit to Supine     Supine to sit: Max assist Sit to supine: Total assist;+2 for physical assistance   General bed mobility comments: Verbal and tactile cues for mobility.  Patient able to initiate movement of LE's off of bed.  Required +2 max assist to move to sitting.  Once upright, required mod assist to maintain balance, leaning posteriorly and to right.  Patient sat EOB x 5 minutes.  Significant posterior lean today, unable to transfer OOB to chair; Returned to supine with +2 total assist.   Transfers                    Ambulation/Gait                 Stairs            Wheelchair Mobility    Modified Rankin (Stroke Patients Only)       Balance   Sitting-balance support: Bilateral upper extremity supported;Feet supported Sitting  balance-[name]: Poor Sitting balance - Comments: Significant posterior lean, even pushing backward                            Cognition Arousal/Alertness: Awake/alert Behavior During Therapy: Restless Overall Cognitive Status: No family/caregiver present to determine baseline cognitive functioning                      Exercises      General Comments        Pertinent Vitals/Pain Faces Pain Scale: Hurts even more Pain Location: Very uncomfortable-looking in sitting Pain Descriptors / Indicators: Grimacing;Guarding Pain Intervention(s): Limited activity within patient's tolerance;Monitored during session;Repositioned    Home Living                      Prior Function            PT Goals (current goals can now be found in the care plan section) Acute Rehab PT Goals Patient Stated Goal: None stated PT Goal Formulation: Patient unable to participate in goal setting Time For Goal Achievement: 05/16/15 Potential to Achieve Goals: Fair Progress towards PT goals: Not progressing toward goals - comment (Abilityto participate waxes and wanes)    Frequency  Min 2X/week    PT Plan Current plan remains appropriate    Co-evaluation  End of Session Equipment Utilized During Treatment:  (bed pad) Activity Tolerance: Patient tolerated treatment well Patient left: in bed;with call bell/phone within reach;Other (comment);with bed alarm set;with nursing/sitter in room (bed in semi-chair position)     Time: [id number] PT Time Calculation (min) (ACUTE ONLY): 16 min  Charges:  $Therapeutic Activity: 8-22 mins                    G Codes:      Olen PelGarrigan, Brandin Stetzer Hamff 05/08/2015, 4:15 PM  Van ClinesHolly Kemiya Batdorf, [place]PT  Acute Rehabilitation Services Pager [phone] Office [phone]

## 2015-05-08 NOTE — Progress Notes (Signed)
Hypoglycemic Event  CBG: 66  Treatment: D50 IV 25 mL  Symptoms: None  Follow-up CBG: Time:2136 CBG Result:106  Possible Reasons for Event: Unknown    Greg Scott, Greg Scott  Remember to initiate Hypoglycemia Order Set & complete

## 2015-05-08 NOTE — Progress Notes (Signed)
Speech Language Pathology Treatment: Dysphagia  Patient Details Name: Greg StallsFred A Pickeral MRN: [id number] DOB: 1923-04-07 Today's Date: 05/08/2015 Time: [id number] SLP Time Calculation (min) (ACUTE ONLY): 12 min  Assessment / Plan / Recommendation Clinical Impression  Skilled treatment session focused on addressing skilled observation with recent upgrade.  Patient consumed nectar-thick liquids via cup with hand-over-hand assist with oral holding/swallow delay of 10-15 seconds with no overt s/s of aspiration today.  Trials were limited today due to patient declining after 3 boluses.  Nurse tech reported limited PO intake at lunch and SLP recommended offering PO intermittently throughout the day and assist patient when he is willing to reduce aspiration risk.     HPI Other Pertinent Information: Greg Scott is a 81 y.o. BM PMHx glaucoma/ blindness, lives at home with family. Has been doing more poorly over the last few months in terms of mobility. Pt's PCP thought that he might have a "pinched nerve" in his back causing bilat LE weakness and frequent falls that the pt was having. In the midst of these issues, the son went away for the weekend and a caretaker was supposed to look after [name]. On returning they found the pt on the floor with dried feces/ urine, poorly responsive to brought to ED.    Pertinent Vitals Pain Assessment: No/denies pain Faces Pain Scale: Hurts even more Pain Location: Very uncomfortable-looking in sitting Pain Descriptors / Indicators: Grimacing;Guarding Pain Intervention(s): Limited activity within patient's tolerance;Monitored during session;Repositioned  SLP Plan  Continue with current plan of care    Recommendations Diet recommendations: Dysphagia 1 (puree);Nectar-thick liquid Liquids provided via: Cup Medication Administration: Crushed with puree Supervision: Staff to assist with self feeding;Full supervision/cueing for compensatory strategies Compensations: Slow  rate;Small sips/bites;Externally pace Postural Changes and/or Swallow Maneuvers: Seated upright 90 degrees;Upright 30-60 min after meal              Oral Care Recommendations: Oral care BID Follow up Recommendations: Skilled Nursing facility;24 hour supervision/assistance Plan: Continue with current plan of care    GO    Charlane FerrettiMelissa Nija Koopman, M.A., CCC-SLP [phone]  Amai Cappiello 05/08/2015, 4:52 PM

## 2015-05-08 NOTE — Progress Notes (Signed)
[facility] PROGRESS NOTE  [name] ZOX:[id number] DOB: 03-03-1923 DOA: 04/22/2015 PCP: No primary care provider on file.  Admit HPI / Brief Narrative: Greg Scott is a 81 y.o. BM PMHx glaucoma/ blindness, lives at home with family. Has been doing more poorly over the last few months in terms of mobility. Pt's PCP thought that he might have a "pinched nerve" in his back causing bilat LE weakness and frequent falls that the pt was having. In the midst of these issues, the son went away for the weekend and a caretaker was supposed to look after Greg Scott. On returning they found the pt on the floor with dried feces/ urine, poorly responsive to brought to ED.   In ED pt was hypothermic, BP 127/86, HR 88, temp 95.2. He looked dehydrated and has rec'd about 4L IVF's so far. Na 146 CO2 23 BUN 35 Creat 1.31. Alb 2.5, Ca 8.1, total bili 1.7, NH3 16. Lactic acid 2.1, WBC 8k, Hb 9.9, Plt 151 Trop 0.04 UA large small LE/ pos nitrite/ 3-6 wbc's CT head showed some small WM changes and moderate cortical volume loss, no acute CVA, no skull fracture. EEG done was negative. MRI/MRA of the head done was negative.  Greg Scott now with confusion and hallucinations likely secondary to dementia and history of alcohol use. Greg Scott refusing oral intake. Greg Scott refusing medications. Greg Scott has been placed on appetite stimulant. Psychiatry has assessed the Greg Scott and Greg Scott was started on low-dose Seroquel which has been increased to 50 mg daily. Palliative care has also been consulted and following along.  Assessment/Plan: Syncope and collapse -Likely multifactorial to include UTI, alcohol abuse (possible seizure); no evidence of stroke or MI -Echocardiogram; see results below -Troponin 3 mildly positive -Brain MRI/MRA negative for acute infarct or masses. -TSH within normal limit -EEG; normal  Alcohol abuse -Alcohol level negative - perhaps Greg Scott's presentation was due to acute alcohol  withdrawal -urine drug screen negative  -Discontinued CIWA protocol, and all narcotics/sedating medication -Passed swallow study.   Acute Encephalopathy/AMS  questionable etiology. Greg Scott with confusion with some hallucinations. May be secondary to a worsening dementia. Greg Scott with history of significant alcohol abuse. -Discontinued all benzodiazepine and narcotics - Greg Scott with bouts of confusion and hallucination. Greg Scott may have a component of dementia. CT head  Negative for any acute abnormalities with moderate cortical volume loss and scattered small vessel ischemic microangiopathy. MRI of the head with moderate atrophy negative for acute infarct or mass. Folate levels at 16.1. B-12 levels at 397. B-12 levels are borderline will give a trial of vitamin B-12 1000 mcg [place] weekly 3 weeks.  - Greg Scott has been seen in consultation by psychiatry and Greg Scott has been started on low-dose Seroquel for hallucinations.  Seroquel to 50 mg daily at bedtime yesterday. Per psychiatry Greg Scott with no capacity. Greg Scott will likely need SNF with 24-hour care on discharge.  Psychiatry following.  -Swallow study; passed; dysphagia 1 honey thick Greg Scott has been refusing Seroquel.   Hypoglycemia Greg Scott noted to have a hypoglycemic spell 7-19. Greg Scott also received some sliding scale insulin this morning. Greg Scott's hypoglycemia likely secondary to decreased oral intake as Greg Scott is refusing to eat and refusing medications. Place on D5 normal saline. Follow. Refusing to eat.   Urinary tract infection with hematuria -Completed 5 day course antibiotics   iron deficiency Anemia May be dilutional in nature. Greg Scott with no overt bleeding.  Anemia panel with iron level of 21. Greg Scott has received IV iron. Greg Scott also on oral  iron supplementation. Hemoglobin currently stable at 7.6. Follow H&H. Transfusion threshold hemoglobin less than 7.  Rib fracture -Remote right fifth and sixth rib fractures; monitor  for now  Hypokalemia/Hyperkalemia -Potassium goal> 4 -Kayexalate 30 gm 1 for hyperkalemia.   Hypomagnesemia  -Replete. Keep magnesium goal > 2   Stage II sacral decubitus Continue frequent turns.  Prophylaxis Heparin for DVT prophylaxis.  Failure to thrive/protein calorie malnutrition Greg Scott with failure to thrive Greg Scott refusing oral intake. Greg Scott with confusion and hallucinations. Greg Scott with CT head and MRI of the head negative for acute abnormalities have a does have moderate atrophy and Greg Scott likely does have underlying dementia. Greg Scott refusing oral medications. Greg Scott refusing to eat. Continue Marinol tablets to see if his diet improve. Palliative care has been consulted for goals of care and further evaluation and management.     Code Status: Full Family Communication: Updated son via telephone.  Disposition Plan: SNF. Monitor oral intake. Hypoglycemia Greg Scott still on D 5.    Consultants:  Psychiatry:  Dr. Elsie SaasJonnalagadda 05/04/2015  Palliative care: Dr. Linna DarnerAnwar 1 05/07/2015  Procedures: 7/4 CT head without contrast;No acute intracranial pathology -Moderate cortical volume loss and scattered small vessel ischemic microangiopathy. 7/5 MRI brain without contrast; -moderate atrophy. -Negative for acute infarct or mass.  7/6 echocardiogram;- LVEF= 55%- 60%. Hypokinesis of the mid-apicalanteroseptal myocardium. 7/8 EEG; normal  Culture 7/4 blood left arm/hand NGTD 7/4 urine negative 7/5 MRSA by PCR negative   Antibiotics:  IV Zosyn 04/22/15 >>> 04/24/15  IV vancomycin 04/22/2015>>>>> 04/24/2015  IV Rocephin 04/24/2015>>>>>> 04/28/2015  HPI/Subjective: Greg Scott  Alert. . Greg Scott confused. Greg Scott refused to eat.    Objective: Filed Vitals:   05/08/15 0856  BP: 131/76  Pulse: 96  Temp: 98.6 F (37 C)  Resp: 17    Intake/Output Summary (Last 24 hours) at 05/08/15 1541 Last data filed at 05/08/15 1338  Gross per 24 hour  Intake     80 ml   Output    150 ml  Net    -70 ml   Filed Weights   04/23/15 0330 05/02/15 0322 05/03/15 2017  Weight: 56.4 kg (124 lb 5.4 oz) 63.3 kg (139 lb 8.8 oz) 65 kg (143 lb 4.8 oz)    Exam:   General:  NAD. Mittens on  Cardiovascular: RRR  Respiratory: CTAB  Abdomen: Soft/NT/ND/+BS  Musculoskeletal: No c/c/e  Data Reviewed: Basic Metabolic Panel:  Recent Labs Lab 05/02/15 0612  05/04/15 0428 05/05/15 0421 05/06/15 0536 05/07/15 0750 05/08/15 0417  NA 141  < > 141 140 142 143 140  K 2.9*  < > 3.5 3.6 3.6 3.0* 3.1*  CL 109  < > 111 111 113* 112* 110  CO2 24  < > 24 23 22 25 24   GLUCOSE 94  < > 109* 75 90 38* 143*  BUN <5*  < > <5* 5* <5* <5* <5*  CREATININE 1.06  < > 1.13 1.14 1.03 1.12 1.02  CALCIUM 8.0*  < > 7.5* 7.7* 7.5* 8.1* 7.6*  MG 1.7  --   --  1.7  --   --  1.6*  < > = values in this interval not displayed. Liver Function Tests:  Recent Labs Lab 05/08/15 0417  AST 27  ALT 20  ALKPHOS 67  BILITOT 1.4*  PROT 5.1*  ALBUMIN 1.8*   No results for input(s): LIPASE, AMYLASE in the last 168 hours. No results for input(s): AMMONIA in the last 168 hours. CBC:  Recent Labs Lab 05/04/15 0428 05/05/15  0421 05/06/15 0536 05/07/15 0750 05/08/15 0417  WBC 8.2 7.6 7.9 8.8 5.9  HGB 7.6* 7.6* 7.6* 8.0* 7.4*  HCT 22.2* 22.9* 22.2* 24.0* 21.9*  MCV 96.9 95.4 93.7 94.9 94.8  PLT 304 329 298 337 315   Cardiac Enzymes: No results for input(s): CKTOTAL, CKMB, CKMBINDEX, TROPONINI in the last 168 hours. BNP (last 3 results) No results for input(s): BNP in the last 8760 hours.  ProBNP (last 3 results) No results for input(s): PROBNP in the last 8760 hours.  CBG:  Recent Labs Lab 05/07/15 2022 05/08/15 0018 05/08/15 0433 05/08/15 0729 05/08/15 1114  GLUCAP 154* 138* 118* 111* 143*    Recent Results (from the past 240 hour(s))  Clostridium Difficile by PCR (not at [facility])     Status: None   Collection Time: 05/01/15  5:10 PM  Result Value Ref Range Status    C difficile by pcr NEGATIVE NEGATIVE Final     Studies: No results found.  Scheduled Meds: . antiseptic oral rinse  7 mL Mouth Rinse q12n4p  . chlorhexidine  15 mL Mouth Rinse BID  . cyanocobalamin  1,000 mcg Intramuscular Weekly  . docusate sodium  100 mg Oral BID  . dronabinol  2.5 mg Oral BID AC  . enoxaparin (LOVENOX) injection  30 mg Subcutaneous Q24H  . feeding supplement (ENSURE ENLIVE)  237 mL Oral TID BM  . folic acid  1 mg Intravenous Daily  . iron polysaccharides  150 mg Oral Daily  . latanoprost  1 drop Both Eyes QHS  . QUEtiapine  50 mg Oral QHS  . thiamine  100 mg Intravenous Daily   Continuous Infusions: . dextrose 5 % and 0.9% NaCl 75 mL/hr at 05/08/15 0148    Principal Problem:   Altered mental status Active Problems:   Volume depletion   Failure to thrive in adult   Blind   Glaucoma   Pyuria   Syncope and collapse   Alcohol abuse   Acute cystitis with hematuria   Fractured rib   Hypokalemia   Pressure ulcer   UTI (lower urinary tract infection)   Hypomagnesemia   Acute encephalopathy   Urinary tract infectious disease   Rib fracture   Encephalopathy acute   Hyperkalemia   Protein-calorie malnutrition, severe   Anemia, iron deficiency   Palliative care encounter   DNR (do not resuscitate) discussion   Hypoglycemia    Time spent: 35 minutes    Korrey Schleicher, Jon Billings A M.D. [facility] Pager [phone]. If 7PM-7AM, please contact night-coverage at [email or web address], password [facility] 05/08/2015, 3:41 PM  LOS: 15 days

## 2015-05-09 ENCOUNTER — Encounter (HOSPITAL_COMMUNITY): Payer: Self-pay | Admitting: Primary Care

## 2015-05-09 DIAGNOSIS — G9340 Encephalopathy, unspecified: Secondary | ICD-10-CM

## 2015-05-09 LAB — BASIC METABOLIC PANEL
Anion gap: 8 (ref 5–15)
CALCIUM: 7.8 mg/dL — AB (ref 8.9–10.3)
CHLORIDE: 111 mmol/L (ref 101–111)
CO2: 22 mmol/L (ref 22–32)
Creatinine, Ser: 0.96 mg/dL (ref 0.61–1.24)
GFR calc Af Amer: >60 mL/min (ref >60)
GFR calc non Af Amer: >60 mL/min (ref >60)
Glucose, Bld: 92 mg/dL (ref 65–99)
POTASSIUM: 3.4 mmol/L — AB (ref 3.5–5.1)
SODIUM: 141 mmol/L (ref 135–145)

## 2015-05-09 LAB — GLUCOSE, CAPILLARY
GLUCOSE-CAPILLARY: 131 mg/dL — AB (ref 65–99)
GLUCOSE-CAPILLARY: 79 mg/dL (ref 65–99)
Glucose-Capillary: 34 mg/dL — CL (ref 65–99)
Glucose-Capillary: 71 mg/dL (ref 65–99)
Glucose-Capillary: 70 mg/dL (ref 65–99)
Glucose-Capillary: 82 mg/dL (ref 65–99)

## 2015-05-09 LAB — CBC
HEMATOCRIT: 23.9 % — AB (ref 39.0–52.0)
Hemoglobin: 7.9 g/dL — ABNORMAL LOW (ref 13.0–17.0)
MCH: 31.3 pg (ref 26.0–34.0)
MCHC: 33.1 g/dL (ref 30.0–36.0)
MCV: 94.8 fL (ref 78.0–100.0)
Platelets: 292 10*3/uL (ref 150–400)
RBC: 2.52 MIL/uL — AB (ref 4.22–5.81)
RDW: 14.1 % (ref 11.5–15.5)
WBC: 5.4 10*3/uL (ref 4.0–10.5)

## 2015-05-09 MED ORDER — POTASSIUM CHLORIDE 10 MEQ/100ML IV SOLN
10.0000 meq | INTRAVENOUS | Status: AC
  Administered 2015-05-09 (×2): 10 meq via INTRAVENOUS
  Filled 2015-05-09 (×2): qty 100

## 2015-05-09 NOTE — Progress Notes (Signed)
[facility] PROGRESS NOTE  JED KUTCH ZOX:[id number] DOB: 26-Jan-1923 DOA: 04/22/2015 PCP: No primary care provider on file.  Admit HPI / Brief Narrative: Greg Scott is a 81 y.o. BM PMHx glaucoma/ blindness, lives at home with family. Has been doing more poorly over the last few months in terms of mobility. Pt's PCP thought that he might have a "pinched nerve" in his back causing bilat LE weakness and frequent falls that the pt was having. In the midst of these issues, the son went away for the weekend and a caretaker was supposed to look after Greg Scott. On returning they found the pt on the floor with dried feces/ urine, poorly responsive to brought to ED.   In ED pt was hypothermic, BP 127/86, HR 88, temp 95.2. He looked dehydrated and has rec'd about 4L IVF's so far. Na 146 CO2 23 BUN 35 Creat 1.31. Alb 2.5, Ca 8.1, total bili 1.7, NH3 16. Lactic acid 2.1, WBC 8k, Hb 9.9, Plt 151 Trop 0.04 UA large small LE/ pos nitrite/ 3-6 wbc's CT head showed some small WM changes and moderate cortical volume loss, no acute CVA, no skull fracture. EEG done was negative. MRI/MRA of the head done was negative.  Greg Scott now with confusion and hallucinations likely secondary to dementia and history of alcohol use. Greg Scott refusing oral intake. Greg Scott refusing medications. Greg Scott has been placed on appetite stimulant. Psychiatry has assessed the Greg Scott and Greg Scott was started on low-dose Seroquel which has been increased to 50 mg daily. Palliative care has also been consulted and following along.  Assessment/Plan: Syncope and collapse -Likely multifactorial to include UTI, alcohol abuse (possible seizure); no evidence of stroke or MI -Echocardiogram; see results below -Troponin 3 mildly positive -Brain MRI/MRA negative for acute infarct or masses. -TSH within normal limit -EEG; normal  Alcohol abuse -Alcohol level negative - perhaps Greg Scott's presentation was due to acute alcohol  withdrawal -urine drug screen negative  -Discontinued CIWA protocol, and all narcotics/sedating medication -Passed swallow study.   Acute Encephalopathy/AMS  questionable etiology. Greg Scott with confusion with some hallucinations. May be secondary to a worsening dementia. Greg Scott with history of significant alcohol abuse. -Discontinued all benzodiazepine and narcotics - Greg Scott with bouts of confusion and hallucination. Greg Scott may have a component of dementia. CT head  Negative for any acute abnormalities with moderate cortical volume loss and scattered small vessel ischemic microangiopathy. MRI of the head with moderate atrophy negative for acute infarct or mass. Folate levels at 16.1. B-12 levels at 397. B-12 levels are borderline will give a trial of vitamin B-12 1000 mcg [place] weekly 3 weeks.  - Greg Scott has been seen in consultation by psychiatry and Greg Scott has been started on low-dose Seroquel for hallucinations.  Seroquel to 50 mg daily at bedtime yesterday. Per psychiatry Greg Scott with no capacity. Greg Scott will likely need SNF with 24-hour care on discharge.  Psychiatry following.  -Swallow study; passed; dysphagia 1 honey thick Greg Scott has been refusing Seroquel. Discussed with son option for NG tube, peg. Greg Scott would not want artificial nutrition.  Suspect Greg Scott has component of dementia.   Hypoglycemia Greg Scott noted to have a hypoglycemic spell 7-19. Greg Scott also received some sliding scale insulin this morning. Greg Scott's hypoglycemia likely secondary to decreased oral intake as Greg Scott is refusing to eat and refusing medications. Place on D5 normal saline. Follow. Refusing to eat.  No artifical nutrition after discussion with son.   Urinary tract infection with hematuria -Completed 5 day course antibiotics   iron  deficiency Anemia May be dilutional in nature. Greg Scott with no overt bleeding.  Anemia panel with iron level of 21. Greg Scott has received IV iron. Greg Scott also on oral  iron supplementation. Hemoglobin currently stable at 7.6. Follow H&H. Transfusion threshold hemoglobin less than 7.  Rib fracture -Remote right fifth and sixth rib fractures; monitor for now  Hypokalemia/Hyperkalemia -Potassium goal> 4 -Kayexalate 30 gm 1 for hyperkalemia.   Hypomagnesemia  -Replete. Keep magnesium goal > 2   Stage II sacral decubitus Continue frequent turns.  Prophylaxis Heparin for DVT prophylaxis.  Failure to thrive/protein calorie malnutrition Greg Scott with failure to thrive Greg Scott refusing oral intake. Greg Scott with confusion and hallucinations. Greg Scott with CT head and MRI of the head negative for acute abnormalities have a does have moderate atrophy and Greg Scott likely does have underlying dementia. Greg Scott refusing oral medications. Greg Scott refusing to eat. Continue Marinol tablets to see if his diet improve. Palliative care has been consulted for goals of care and further evaluation and management.  Greg Scott continue to refuse meals, medications. Long discussion with son regarding options for artificial nutrition , ; NG tube, Peg tube, or more comfort approach. He think his father would not want artifical nutrition. Will proceed with more comfort approach. Will stop IV fluids tomorrow at time of transfer.  Palliative team will review most form with Greg Scott son 7-22   Code Status: Full Family Communication: Updated son via telephone.  Disposition Plan: SNF 7-22, need to arrange palliative, hospice to follow at the facility   Consultants:  Psychiatry:  Dr. Elsie Saas 05/04/2015  Palliative care: Dr. Linna Darner 1 05/07/2015  Procedures: 7/4 CT head without contrast;No acute intracranial pathology -Moderate cortical volume loss and scattered small vessel ischemic microangiopathy. 7/5 MRI brain without contrast; -moderate atrophy. -Negative for acute infarct or mass.  7/6 echocardiogram;- LVEF= 55%- 60%. Hypokinesis of the  mid-apicalanteroseptal myocardium. 7/8 EEG; normal  Culture 7/4 blood left arm/hand NGTD 7/4 urine negative 7/5 MRSA by PCR negative   Antibiotics:  IV Zosyn 04/22/15 >>> 04/24/15  IV vancomycin 04/22/2015>>>>> 04/24/2015  IV Rocephin 04/24/2015>>>>>> 04/28/2015  HPI/Subjective: Greg Scott  Alert. . Greg Scott confused. Greg Scott refused to eat. Refuse medications.    Objective: Filed Vitals:   05/09/15 0925  BP: 152/87  Pulse: 100  Temp: 97.5 F (36.4 C)  Resp: 18    Intake/Output Summary (Last 24 hours) at 05/09/15 1443 Last data filed at 05/09/15 0900  Gross per 24 hour  Intake    320 ml  Output      0 ml  Net    320 ml   Filed Weights   05/02/15 0322 05/03/15 2017 05/08/15 2014  Weight: 63.3 kg (139 lb 8.8 oz) 65 kg (143 lb 4.8 oz) 63.2 kg (139 lb 5.3 oz)    Exam:   General:  NAD. Mittens on  Cardiovascular: RRR  Respiratory: CTAB  Abdomen: Soft/NT/ND/+BS  Musculoskeletal: No c/c/e  Data Reviewed: Basic Metabolic Panel:  Recent Labs Lab 05/05/15 0421 05/06/15 0536 05/07/15 0750 05/08/15 0417 05/09/15 0510  NA 140 142 143 140 141  K 3.6 3.6 3.0* 3.1* 3.4*  CL 111 113* 112* 110 111  CO2 23 22 25 24 22   GLUCOSE 75 90 38* 143* 92  BUN 5* <5* <5* <5* <5*  CREATININE 1.14 1.03 1.12 1.02 0.96  CALCIUM 7.7* 7.5* 8.1* 7.6* 7.8*  MG 1.7  --   --  1.6*  --    Liver Function Tests:  Recent Labs Lab 05/08/15 0417  AST 27  ALT 20  ALKPHOS 67  BILITOT 1.4*  PROT 5.1*  ALBUMIN 1.8*   No results for input(s): LIPASE, AMYLASE in the last 168 hours. No results for input(s): AMMONIA in the last 168 hours. CBC:  Recent Labs Lab 05/05/15 0421 05/06/15 0536 05/07/15 0750 05/08/15 0417 05/09/15 0510  WBC 7.6 7.9 8.8 5.9 5.4  HGB 7.6* 7.6* 8.0* 7.4* 7.9*  HCT 22.9* 22.2* 24.0* 21.9* 23.9*  MCV 95.4 93.7 94.9 94.8 94.8  PLT 329 298 337 315 292   Cardiac Enzymes: No results for input(s): CKTOTAL, CKMB, CKMBINDEX, TROPONINI in the last 168  hours. BNP (last 3 results) No results for input(s): BNP in the last 8760 hours.  ProBNP (last 3 results) No results for input(s): PROBNP in the last 8760 hours.  CBG:  Recent Labs Lab 05/08/15 2136 05/08/15 2348 05/09/15 0408 05/09/15 0735 05/09/15 1127  GLUCAP 106* 73 71 70 131*    Recent Results (from the past 240 hour(s))  Clostridium Difficile by PCR (not at [facility])     Status: None   Collection Time: 05/01/15  5:10 PM  Result Value Ref Range Status   C difficile by pcr NEGATIVE NEGATIVE Final     Studies: No results found.  Scheduled Meds: . antiseptic oral rinse  7 mL Mouth Rinse q12n4p  . chlorhexidine  15 mL Mouth Rinse BID  . cyanocobalamin  1,000 mcg Intramuscular Weekly  . docusate sodium  100 mg Oral BID  . dronabinol  2.5 mg Oral BID AC  . enoxaparin (LOVENOX) injection  30 mg Subcutaneous Q24H  . feeding supplement (ENSURE ENLIVE)  237 mL Oral TID BM  . folic acid  1 mg Intravenous Daily  . iron polysaccharides  150 mg Oral Daily  . latanoprost  1 drop Both Eyes QHS  . QUEtiapine  50 mg Oral QHS  . thiamine  100 mg Intravenous Daily   Continuous Infusions: . dextrose 5 % and 0.9% NaCl 75 mL/hr at 05/09/15 0847    Principal Problem:   Altered mental status Active Problems:   Volume depletion   Failure to thrive in adult   Blind   Glaucoma   Pyuria   Syncope and collapse   Alcohol abuse   Acute cystitis with hematuria   Fractured rib   Hypokalemia   Pressure ulcer   UTI (lower urinary tract infection)   Hypomagnesemia   Acute encephalopathy   Urinary tract infectious disease   Rib fracture   Encephalopathy acute   Hyperkalemia   Protein-calorie malnutrition, severe   Anemia, iron deficiency   Palliative care encounter   DNR (do not resuscitate) discussion   Hypoglycemia    Time spent: 35 minutes    Greg Scott, Jon Billings A M.D. [facility] Pager [phone]. If 7PM-7AM, please contact night-coverage at [email or web address], password  [facility] 05/09/2015, 2:43 PM  LOS: 16 days

## 2015-05-09 NOTE — Progress Notes (Signed)
Daily Progress Note   Patient Name: Araceli A Bambrick       Date: 05/09/2015 DOB: October 01, 1923  Age: 81 y.o. MRN#: [id number] Attending Physician: Alba Cory, MD Primary Care Physician: No primary care provider on file. Admit Date: 04/22/2015  Reason for Consultation/Follow-up: Establishing goals of care and Psychosocial/spiritual support  Subjective:  Mr. Esau is somewhat more alert today, sitting up in bed trying to get his mittens off.  He turns his head to face speaker, but is blind.   He eats a few bites of ice cream but has difficulty opening his mouth fully.  Nursing staff state he is expected to transfer to [facility] later today and they have concerns regarding his episodes of low blood sugar, and the risk for SNF staff to send him back to the hospital.    A message was left on son, Oluwademilade's voice mail regarding the goal of transfer, the MOST form, and the preference to err on the side of high blood sugar for Mr. Blandon.  Son, Elih was encouraged to call PMT with questions or concerns.    Interval Events: Mr. Nicasio has completed a bedside swallowing eval and noted to need dysphagia 1 diet, puree with nectar thick liquids. Crushed meds with puree.    Some episodes of hypoglycemia.   Length of Stay: 16 days  Current Medications: Scheduled Meds:  . antiseptic oral rinse  7 mL Mouth Rinse q12n4p  . chlorhexidine  15 mL Mouth Rinse BID  . cyanocobalamin  1,000 mcg Intramuscular Weekly  . docusate sodium  100 mg Oral BID  . dronabinol  2.5 mg Oral BID AC  . enoxaparin (LOVENOX) injection  30 mg Subcutaneous Q24H  . feeding supplement (ENSURE ENLIVE)  237 mL Oral TID BM  . folic acid  1 mg Intravenous Daily  . iron polysaccharides  150 mg Oral Daily  . latanoprost  1 drop Both Eyes QHS  . QUEtiapine  50 mg Oral QHS  . thiamine  100 mg Intravenous Daily    Continuous Infusions: . dextrose 5 % and 0.9% NaCl 75 mL/hr at 05/09/15 0847    PRN Meds: RESOURCE THICKENUP  CLEAR  Palliative Performance Scale: 30%     Vital Signs: BP 152/87 mmHg  Pulse 100  Temp(Src) 97.5 F (36.4 C) (Oral)  Resp 18  Ht  (1.803 m)  Wt 63.2 kg (139 lb 5.3 oz)  BMI 19.44 kg/m2  SpO2 97% SpO2: SpO2: 97 % O2 Device: O2 Device: Not Delivered O2 Flow Rate:    Intake/output summary:  Intake/Output Summary (Last 24 hours) at 05/09/15 1020 Last data filed at 05/09/15 0900  Gross per 24 hour  Intake    320 ml  Output    150 ml  Net    170 ml   LBM:   Baseline Weight: Weight: 54.432 kg (120 lb) Most recent weight: Weight: 63.2 kg (139 lb 5.3 oz)  Physical Exam: Constitutional: sittin gup in bed trying ot get mittens off. Turns head to speaker, but blind.      RESP: even and non labored GI:  Soft non tender EXT: no edema.           Additional Data Reviewed: Recent Labs     05/08/15  0417  05/09/15  0510  WBC  5.9  5.4  HGB  7.4*  7.9*  PLT  315  2FIN [id number]  141  BUN  <5*  <5*  CREATININE  1.02  0.96     Problem List:  Patient Active Problem List   Diagnosis Date Noted  . Palliative care encounter 05/07/2015  . DNR (do not resuscitate) discussion   . Hypoglycemia   . Anemia, iron deficiency   . Protein-calorie malnutrition, severe 05/02/2015  . Encephalopathy acute   . Hyperkalemia   . Acute encephalopathy   . Urinary tract infectious disease   . Rib fracture   . UTI (lower urinary tract infection)   . Hypomagnesemia   . Pressure ulcer 04/24/2015  . Volume depletion 04/23/2015  . Altered mental status 04/23/2015  . Failure to thrive in adult 04/23/2015  . Blind 04/23/2015  . Glaucoma 04/23/2015  . Pyuria 04/23/2015  . AKI (acute kidney injury)   . Hypothermia   . Syncope and collapse   . Alcohol abuse   . Acute cystitis with hematuria   . Fractured rib   . Hypokalemia      Palliative Care Assessment & Plan    Code Status:  Full code   Continued discussion regarding end of life issues/MOST form.   Goals of  Care:  Residential SNF placement.   Prognosis: Unable to determine  Discharge Planning: Skilled Nursing Facility for rehab with Palliative care service follow-up.  Mr. Hauk is expected to be transferred to [facility] today.     Care plan was discussed with nursing and CM.   Thank you for allowing the Palliative Medicine Team to assist in the care of this patient.   Time In:  0945 Time Out:  1020 Total Time  35 minutes Prolonged Time Billed  no     Greater than 50%  of this time was spent counseling and coordinating care related to the above assessment and plan.   Katheran Awe, NP  05/09/2015, 10:20 AM  Please contact Palliative Medicine Team phone at [phone] for questions and concerns.

## 2015-05-09 NOTE — Clinical Social Work Placement (Signed)
   CLINICAL SOCIAL WORK PLACEMENT  NOTE  Date:  05/09/2015  Patient Details  Name: Greg Scott MRN: [id number] Date of Birth: 12-21-1922  Clinical Social Work is seeking post-discharge placement for this patient at the Skilled  Nursing Facility level of care (*CSW will initial, date and re-position this form in  chart as items are completed):  Yes   Patient/family provided with [facility] Clinical Social Work Department's list of facilities offering this level of care within the geographic area requested by the patient (or if unable, by the patient's family).  Yes   Patient/family informed of their freedom to choose among providers that offer the needed level of care, that participate in Medicare, Medicaid or managed care program needed by the patient, have an available bed and are willing to accept the patient.  Yes   Patient/family informed of [facility]'s ownership interest in [facility] and [facility], as well as of the fact that they are under no obligation to receive care at these facilities.  PASRR submitted to EDS on 05/01/15     PASRR number received on 05/01/15     Existing PASRR number confirmed on       FL2 transmitted to all facilities in geographic area requested by pt/family on 05/02/15     FL2 transmitted to all facilities within larger geographic area on       Patient informed that his/her managed care company has contracts with or will negotiate with certain facilities, including the following:         05/09/15   Patient/family informed of bed offers received.  Patient chooses bed at  [facility]     Physician recommends and patient chooses bed at      Patient to be transferred to  [facility] on  05/10/15.  Patient to be transferred to facility by  ambulance     Patient family notified on   of transfer.  Name of family member notified:        PHYSICIAN Please sign FL2     Additional Comment:     _______________________________________________ Cristobal Goldmann, LCSW 05/09/2015, 4:42 PM

## 2015-05-09 NOTE — Care Management Important Message (Signed)
Important Message  Patient Details  Name: Greg Scott MRN: [id number] Date of Birth: December 07, 1922   Medicare Important Message Given:  Yes-third notification given    Yvonna Alanis 05/09/2015, 1:48 PM

## 2015-05-10 DIAGNOSIS — F1010 Alcohol abuse, uncomplicated: Secondary | ICD-10-CM

## 2015-05-10 LAB — GLUCOSE, CAPILLARY
GLUCOSE-CAPILLARY: 90 mg/dL (ref 65–99)
Glucose-Capillary: 112 mg/dL — ABNORMAL HIGH (ref 65–99)
Glucose-Capillary: 137 mg/dL — ABNORMAL HIGH (ref 65–99)
Glucose-Capillary: 63 mg/dL — ABNORMAL LOW (ref 65–99)
Glucose-Capillary: 82 mg/dL (ref 65–99)

## 2015-05-10 MED ORDER — DEXTROSE 50 % IV SOLN
INTRAVENOUS | Status: AC
  Administered 2015-05-10: 25 mL
  Filled 2015-05-10: qty 50

## 2015-05-10 MED ORDER — DRONABINOL 2.5 MG PO CAPS: 2.5000 mg | ORAL_CAPSULE | Freq: Two times a day (BID) | ORAL | Status: AC

## 2015-05-10 MED ORDER — QUETIAPINE FUMARATE 25 MG PO TABS: 50.0000 mg | ORAL_TABLET | Freq: Every day | ORAL | Status: AC

## 2015-05-10 MED ORDER — ENSURE ENLIVE PO LIQD: 237.0000 mL | Freq: Three times a day (TID) | ORAL | Status: AC

## 2015-05-10 MED ORDER — POLYSACCHARIDE IRON COMPLEX 150 MG PO CAPS: 150.0000 mg | ORAL_CAPSULE | Freq: Every day | ORAL | Status: AC

## 2015-05-10 MED ORDER — CYANOCOBALAMIN 1000 MCG/ML IJ SOLN: 1000.0000 ug | INTRAMUSCULAR | Status: AC

## 2015-05-10 NOTE — Progress Notes (Signed)
Pt left floor via stretcher by [facility] to [facility]. 

## 2015-05-10 NOTE — Progress Notes (Signed)
Hypoglycemic Event  CBG: 63  Treatment: D50 IV 25 mL  Symptoms: none  Follow-up CBG: Time:0052 CBG Result:137  Possible Reasons for Event: Inadequate meal intake  Comments/MD notified:per hypoglycemia protocol    Greg Scott  Remember to initiate Hypoglycemia Order Set & complete

## 2015-05-10 NOTE — Clinical Social Work Placement (Signed)
   CLINICAL SOCIAL WORK PLACEMENT  NOTE  Date:  05/10/2015  Patient Details  Name: Greg Scott MRN: [id number] Date of Birth: 1923-04-26  Clinical Social Work is seeking post-discharge placement for this patient at the Skilled  Nursing Facility level of care (*CSW will initial, date and re-position this form in  chart as items are completed):  Yes   Patient/family provided with [place] [facility] Clinical Social Work Department's list of facilities offering this level of care within the geographic area requested by the patient (or if unable, by the patient's family).  Yes   Patient/family informed of their freedom to choose among providers that offer the needed level of care, that participate in Medicare, Medicaid or managed care program needed by the patient, have an available bed and are willing to accept the patient.  Yes   Patient/family informed of [facility]'s ownership interest in [facility] and [facility], as well as of the fact that they are under no obligation to receive care at these facilities.  PASRR submitted to EDS on 05/01/15     PASRR number received on 05/01/15     Existing PASRR number confirmed on       FL2 transmitted to all facilities in geographic area requested by pt/family on 05/02/15     FL2 transmitted to all facilities within larger geographic area on       Patient informed that his/her managed care company has contracts with or will negotiate with certain facilities, including the following:         Yes   Patient/family informed of bed offers received.  Patient chooses bed at  [facility]     Physician recommends and patient chooses bed at      Patient to be transferred to  [facility] on  05/10/15.  Patient to be transferred to facility by  ambulance     Patient family notified on  05/10/15 of transfer.  Name of family member notified:   Son, Dicky Boer     PHYSICIAN Please sign FL2     Additional Comment:     _______________________________________________ Cristobal Goldmann, LCSW 05/10/2015, 4:39 PM

## 2015-05-10 NOTE — Progress Notes (Signed)
Speech Language Pathology Treatment: Dysphagia  Patient Details Name: Greg Scott MRN: [id number] DOB: 03-01-1923 Today's Date: 05/10/2015 Time: 4098-1191 SLP Time Calculation (min) (ACUTE ONLY): 21 min  Assessment / Plan / Recommendation Clinical Impression  Dysphagia treatment provided today for diet tolerance. Pt was alert and cooperative but cognitive deficits continue to negatively impact ability to consume POs- max verbal/ tactile cues and hand-over-hand assist for feeding provided. Pt pursed lips closed ~75% of trials despite cues to open mouth- it does not appear that pt is refusing POs; rather, cognitive deficits are resulting in reduced ability to comprehend and attend to task. When pt did consume POs, did not appear to initiate swallow for 5-10 seconds. Pt had a delayed throat clear for 2 of 6 trials. It appears that pt will have difficulties achieving adequate nutrition and hydrations via POs, and pt continues to be at high aspiration risk. Recommend continuing dysphagia 1/ nectar thick liquids, continue to offer POs throughout day rather than only at meals. Reviewed recommendations with RN. Will continue to follow.    HPI Other Pertinent Information: Greg Scott is a 81 y.o. BM PMHx glaucoma/ blindness, lives at home with family. Has been doing more poorly over the last few months in terms of mobility. Pt's PCP thought that he might have a "pinched nerve" in his back causing bilat LE weakness and frequent falls that the pt was having. In the midst of these issues, the son went away for the weekend and a caretaker was supposed to look after Greg Scott. On returning they found the pt on the floor with dried feces/ urine, poorly responsive to brought to ED.    Pertinent Vitals Pain Assessment: Faces Faces Pain Scale: No hurt  SLP Plan  Continue with current plan of care    Recommendations Diet recommendations: Dysphagia 1 (puree);Nectar-thick liquid Liquids provided via: Cup Medication  Administration: Crushed with puree Supervision: Staff to assist with self feeding;Full supervision/cueing for compensatory strategies Compensations: Slow rate;Small sips/bites;Check for pocketing Postural Changes and/or Swallow Maneuvers: Seated upright 90 degrees;Upright 30-60 min after meal              Oral Care Recommendations: Oral care BID Follow up Recommendations: Skilled Nursing facility;24 hour supervision/assistance Plan: Continue with current plan of care    GO     Metro Kung, MA, CCC-SLP 05/10/2015, 9:14 AM

## 2015-05-10 NOTE — Progress Notes (Signed)
Called report to Antionette at [facility].

## 2015-05-10 NOTE — Consult Note (Signed)
[facility] Consult follow-up  Reason for Consult:  Confusion and alcohol abuse Referring Physician:  Dr. Grandville Silos Patient Identification: Greg Scott MRN:  [id number] Principal Diagnosis: Altered mental status Diagnosis:   Patient Active Problem List   Diagnosis Date Noted  . Palliative care encounter [Z51.5] 05/07/2015  . DNR (do not resuscitate) discussion [Z71.89]   . Hypoglycemia [E16.2]   . Anemia, iron deficiency [D50.9]   . Protein-calorie malnutrition, severe [E43] 05/02/2015  . Encephalopathy acute [G93.40]   . Hyperkalemia [E87.5]   . Acute encephalopathy [G93.40]   . Urinary tract infectious disease [N39.0]   . Rib fracture [S22.39XA]   . UTI (lower urinary tract infection) [N39.0]   . Hypomagnesemia [E83.42]   . Pressure ulcer [L89.90] 04/24/2015  . Volume depletion [E86.9] 04/23/2015  . Altered mental status [R41.82] 04/23/2015  . Failure to thrive in adult [R62.7] 04/23/2015  . Blind [H54.0] 04/23/2015  . Glaucoma [H40.9] 04/23/2015  . Pyuria [N39.0] 04/23/2015  . AKI (acute kidney injury) [N17.9]   . Hypothermia [T68.XXXA]   . Syncope and collapse [R55]   . Alcohol abuse [F10.10]   . Acute cystitis with hematuria [N30.01]   . Fractured rib [S22.39XA]   . Hypokalemia [E87.6]     Total Time spent with patient: 30 minutes  Subjective:   Greg Scott is a 81 y.o. male patient admitted with confusion.  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 Patient has history of drinking since he has been retired and has been slowly deteriorated his mental and physical condition. He needs 24 hours care and bed ridden at this time. Patient is poor historian, verbal responses are unpredictable and does not makes sense when he responds.  Reportedly his son believes that he has been depressed over three months and base line is able to communicate appropriately and at times can feed himself. He has recent one month history of syncopal episodes. He appeared lying down on his bed awake, not alert and oriented to place, person, time and situation. He does talk spontaneously and observed talking to himself even no one in his room. Patient has no history of substance abuse or psychiatric treatment as out patient or inpatient.   Interval history: Patient is awake, alert but not oriented to place, person, time and situation. He is calm, cooperative and answering yes or No responses. Patient alsotalking to himself and talking with people who were not there from time to time. Patient is blind secondary to glaucoma and cannot see even short distance like counting fingers. Dr. Tyrell Antonio stated that he has been non compliant with medication seroquel and refusing it, so we will change to Seroquel PRN. Patient is bedridden and needs 24 hours care services. Patient has no extrapyramidal symptoms when he was taken medication and was much calmer.   Past Medical History:  Past Medical History  Diagnosis Date  . Glaucoma   . Blind    History reviewed. No pertinent past surgical history. Family History: History reviewed. No pertinent family history. Social History:  History  Alcohol Use: Not on file     History  Drug Use Not on file    History   Social History  . Marital Status: Married    Spouse Name: N/A  . Number of Children: N/A  .  Years of Education: N/A   Social History Main Topics  . Smoking status: Never Smoker   . Smokeless tobacco: Not on file  . Alcohol Use: Not on file  . Drug Use: Not on file  . Sexual Activity: Not on file   Other Topics Concern  . None   Social History Narrative   Additional Social History:                          Allergies:  No Known Allergies  Labs:  Results for orders placed or  performed during the hospital encounter of 04/22/15 (from the past 48 hour(s))  Glucose, capillary     Status: Abnormal   Collection Time: 05/08/15  4:13 PM  Result Value Ref Range   Glucose-Capillary 146 (H) 65 - 99 mg/dL  Glucose, capillary     Status: None   Collection Time: 05/08/15  8:10 PM  Result Value Ref Range   Glucose-Capillary 66 65 - 99 mg/dL  Glucose, capillary     Status: Abnormal   Collection Time: 05/08/15  9:36 PM  Result Value Ref Range   Glucose-Capillary 106 (H) 65 - 99 mg/dL  Glucose, capillary     Status: None   Collection Time: 05/08/15 11:48 PM  Result Value Ref Range   Glucose-Capillary 73 65 - 99 mg/dL  Glucose, capillary     Status: None   Collection Time: 05/09/15  4:08 AM  Result Value Ref Range   Glucose-Capillary 71 65 - 99 mg/dL  CBC     Status: Abnormal   Collection Time: 05/09/15  5:10 AM  Result Value Ref Range   WBC 5.4 4.0 - 10.5 K/uL   RBC 2.52 (L) 4.22 - 5.81 MIL/uL   Hemoglobin 7.9 (L) 13.0 - 17.0 g/dL   HCT 23.9 (L) 39.0 - 52.0 %   MCV 94.8 78.0 - 100.0 fL   MCH 31.3 26.0 - 34.0 pg   MCHC 33.1 30.0 - 36.0 g/dL   RDW 14.1 11.5 - 15.5 %   Platelets 292 150 - 400 K/uL  Basic metabolic panel     Status: Abnormal   Collection Time: 05/09/15  5:10 AM  Result Value Ref Range   Sodium 141 135 - 145 mmol/L   Potassium 3.4 (L) 3.5 - 5.1 mmol/L   Chloride 111 101 - 111 mmol/L   CO2 22 22 - 32 mmol/L   Glucose, Bld 92 65 - 99 mg/dL   BUN <5 (L) 6 - 20 mg/dL   Creatinine, Ser 0.96 0.61 - 1.24 mg/dL   Calcium 7.8 (L) 8.9 - 10.3 mg/dL   GFR calc non Af Amer >60 >60 mL/min   GFR calc Af Amer >60 >60 mL/min    Comment: (NOTE) The eGFR has been calculated using the CKD EPI equation. This calculation has not been validated in all clinical situations. eGFR's persistently <60 mL/min signify possible Chronic Kidney Disease.    Anion gap 8 5 - 15  Glucose, capillary     Status: None   Collection Time: 05/09/15  7:35 AM  Result Value Ref Range    Glucose-Capillary 70 65 - 99 mg/dL  Glucose, capillary     Status: Abnormal   Collection Time: 05/09/15 11:27 AM  Result Value Ref Range   Glucose-Capillary 131 (H) 65 - 99 mg/dL  Glucose, capillary     Status: None   Collection Time: 05/09/15  4:28 PM  Result Value Ref Range  Glucose-Capillary 79 65 - 99 mg/dL   Comment 1 Notify RN    Comment 2 Document in Chart   Glucose, capillary     Status: None   Collection Time: 05/09/15  8:32 PM  Result Value Ref Range   Glucose-Capillary 82 65 - 99 mg/dL  Glucose, capillary     Status: Abnormal   Collection Time: 05/10/15 12:01 AM  Result Value Ref Range   Glucose-Capillary 63 (L) 65 - 99 mg/dL  Glucose, capillary     Status: Abnormal   Collection Time: 05/10/15 12:52 AM  Result Value Ref Range   Glucose-Capillary 137 (H) 65 - 99 mg/dL  Glucose, capillary     Status: Abnormal   Collection Time: 05/10/15  4:08 AM  Result Value Ref Range   Glucose-Capillary 112 (H) 65 - 99 mg/dL  Glucose, capillary     Status: None   Collection Time: 05/10/15  8:38 AM  Result Value Ref Range   Glucose-Capillary 90 65 - 99 mg/dL    Vitals: Blood pressure 129/75, pulse 91, temperature 97.8 F (36.6 C), temperature source Axillary, resp. rate 18, height _0  (1.803 m), weight 63.6 kg (140 lb 3.4 oz), SpO2 96 %.  Risk to Self: Is patient at risk for suicide?: No Risk to Others:   Prior Inpatient Therapy:   Prior Outpatient Therapy:    Current Facility-Administered Medications  Medication Dose Route Frequency Provider Last Rate Last Dose  . antiseptic oral rinse (CPC / CETYLPYRIDINIUM CHLORIDE 0.05%) solution 7 mL  7 mL Mouth Rinse q12n4p Roney Jaffe, MD   7 mL at 05/09/15 1600  . chlorhexidine (PERIDEX) 0.12 % solution 15 mL  15 mL Mouth Rinse BID Roney Jaffe, MD   15 mL at 05/10/15 1032  . cyanocobalamin ((VITAMIN B-12)) injection 1,000 mcg  1,000 mcg Intramuscular Weekly Eugenie Filler, MD   1,000 mcg at 05/05/15 1323  . dextrose 5  %-0.9 % sodium chloride infusion   Intravenous Continuous Belkys A Regalado, MD 75 mL/hr at 05/10/15 1042    . docusate sodium (COLACE) capsule 100 mg  100 mg Oral BID Eugenie Filler, MD   100 mg at 05/08/15 1054  . dronabinol (MARINOL) capsule 2.5 mg  2.5 mg Oral BID AC Eugenie Filler, MD   2.5 mg at 05/06/15 1700  . enoxaparin (LOVENOX) injection 30 mg  30 mg Subcutaneous Q24H Allie Bossier, MD   30 mg at 05/09/15 1223  . feeding supplement (ENSURE ENLIVE) (ENSURE ENLIVE) liquid 237 mL  237 mL Oral TID BM Dale Bessie, RD   237 mL at 05/10/15 1031  . folic acid injection 1 mg  1 mg Intravenous Daily Kimberly B Hammons, RPH   1 mg at 05/10/15 1031  . iron polysaccharides (NIFEREX) capsule 150 mg  150 mg Oral Daily Eugenie Filler, MD   150 mg at 05/10/15 1032  . latanoprost (XALATAN) 0.005 % ophthalmic solution 1 drop  1 drop Both Eyes QHS Cherene Altes, MD   1 drop at 05/09/15 2313  . QUEtiapine (SEROQUEL) tablet 50 mg  50 mg Oral QHS Eugenie Filler, MD   50 mg at 05/06/15 2148  . [place]   Oral PRN Allie Bossier, MD      . thiamine (B-1) injection 100 mg  100 mg Intravenous Daily Theone Murdoch Hammons, RPH   100 mg at 05/10/15 1032    Musculoskeletal: Strength & Muscle Tone: decreased Gait & Station: unable to  stand Patient leans: N/A  Psychiatric Specialty Exam: Physical Exam   ROS   Blood pressure 129/75, pulse 91, temperature 97.8 F (36.6 C), temperature source Axillary, resp. rate 18, height _0  (1.803 m), weight 63.6 kg (140 lb 3.4 oz), SpO2 96 %.Body mass index is 19.56 kg/(m^2).  General Appearance: Disheveled and Guarded  Eye Contact::  Minimal  Speech:  Clear and Coherent  Volume:  Normal  Mood:  Anxious  Affect:  Depressed and Flat  Thought Process:  Coherent and Goal Directed  Orientation:  Negative  Thought Content:  Hallucinations: Visual  Suicidal Thoughts:  No  Homicidal Thoughts:  No  Memory:  Immediate;   Poor Recent;    Poor  Judgement:  Poor  Insight:  Negative  Psychomotor Activity:  Decreased  Concentration:  Fair  Recall:  [employer] of Knowledge:Poor  Language: Fair  Akathisia:  Negative  Handed:  Right  AIMS (if indicated):     Assets:  Catering manager Housing Leisure Time Social Support Transportation  ADL's:  Impaired  Cognition: Impaired,  Severe  Sleep:      Medical Decision Making: Review of Psycho-Social Stressors (1), Review or order clinical lab tests (1), Established Problem, Worsening (2), Review of Last Therapy Session (1), Review or order medicine tests (1), Review of Medication Regimen & Side Effects (2) and Review of New Medication or Change in Dosage (2)  Treatment Plan Summary: Patient  with visual hallucinations and blindness due to glaucoma, withdrawn, depression, history of alcohol abuse and multiple medical issues. Patient slowly responding his current medication management and has no reported agitation and less hallucinations during this visit.   Daily contact with patient to assess and evaluate symptoms and progress in treatment and Medication management  Plan: Patient has no capacity to make medical decisions or living arrangements Continue Seroquel 50 mg at bed time PRN for hallucination or agitation Patient does not meet criteria for psychiatric inpatient admission. Supportive therapy provided about ongoing stressors.  Appreciate psychiatric consultation and will sign off at this time. Please contact [phone] or [phone] if needs further assistance   Disposition: Patient benefit from out of home placement - SNF as he needs 24 hours care when discharged from hospital.   [facility] R. 05/10/2015 11:34 AM

## 2015-05-10 NOTE — Progress Notes (Signed)
Physician notified: Regalado At: 1152  Regarding: Code status clarification. Listed at partial code, but information shows DNR.  Awaiting return response.   Returned Response at: 1152  Order(s): DNR.

## 2015-05-10 NOTE — Discharge Summary (Signed)
Physician Discharge Summary  Greg Scott:[id number] DOB: 09-15-1923 DOA: 04/22/2015  PCP: No primary care provider on file.  Admit date: 04/22/2015 Discharge date: 05/10/2015  Time spent: 35 minutes  Recommendations for Outpatient Follow-up:  Needs to be follow by palliative care hospice team.   Discharge Diagnoses:    Altered mental status   Volume depletion   Failure to thrive in adult   Blind   Glaucoma   Pyuria   Syncope and collapse   Alcohol abuse   Acute cystitis with hematuria   Fractured rib   Hypokalemia   Pressure ulcer   UTI (lower urinary tract infection)   Hypomagnesemia   Acute encephalopathy   Urinary tract infectious disease   Rib fracture   Encephalopathy acute   Hyperkalemia   Protein-calorie malnutrition, severe   Anemia, iron deficiency   Palliative care encounter   DNR (do not resuscitate) discussion   Hypoglycemia   Discharge Condition: stable.   Diet recommendation: Dysphagia 1 nectar thick.   Filed Weights   05/03/15 2017 05/08/15 2014 05/09/15 2034  Weight: 65 kg (143 lb 4.8 oz) 63.2 kg (139 lb 5.3 oz) 63.6 kg (140 lb 3.4 oz)    History of present illness:  HPI: Greg Scott is a 81 y.o. male with hx glaucoma/ blindness, lives at home with family. Has been doing more poorly over the last few months in terms of mobility. Pt's PCP thought that he might have a "pinched nerve" in his back causing bilat LE weakness and frequent falls that the pt was having. In the midst of these issues, the son went away for the weekend and a caretaker was supposed to look after Mr Weekes. On returning they found the pt on the floor with dried feces/ urine, poorly responsive to brought to ED.   In ED pt was hypothermic, BP 127/86, HR 88, temp 95.2. He looked dehydrated and has rec'd about 4L IVF's so far. Na 146 CO2 23 BUN 35 Creat 1.31. Alb 2.5, Ca 8.1, total bili 1.7, NH3 16. Lactic acid 2.1, WBC 8k, Hb 9.9, Plt 151 Trop 0.04 UA large small LE/ pos  nitrite/ 3-6 wbc's CT head showed some small WM changes, no acute CVA, no skull fracture  Hospital Course:  Admit HPI / Brief Narrative: Greg Scott is a 81 y.o. BM PMHx glaucoma/ blindness, lives at home with family. Has been doing more poorly over the last few months in terms of mobility. Pt's PCP thought that he might have a "pinched nerve" in his back causing bilat LE weakness and frequent falls that the pt was having. In the midst of these issues, the son went away for the weekend and a caretaker was supposed to look after Mr Sprung. On returning they found the pt on the floor with dried feces/ urine, poorly responsive to brought to ED.   In ED pt was hypothermic, BP 127/86, HR 88, temp 95.2. He looked dehydrated and has rec'd about 4L IVF's so far. Na 146 CO2 23 BUN 35 Creat 1.31. Alb 2.5, Ca 8.1, total bili 1.7, NH3 16. Lactic acid 2.1, WBC 8k, Hb 9.9, Plt 151 Trop 0.04 UA large small LE/ pos nitrite/ 3-6 wbc's CT head showed some small WM changes and moderate cortical volume loss, no acute CVA, no skull fracture. EEG done was negative. MRI/MRA of the head done was negative.  Patient now with confusion and hallucinations likely secondary to dementia and history of alcohol use. Patient refusing oral intake.  Patient refusing medications. Patient has been placed on appetite stimulant. Psychiatry has assessed the patient and patient was started on low-dose Seroquel which has been increased to 50 mg daily. Palliative care has also been consulted and following along.  Assessment/Plan: Syncope and collapse -Likely multifactorial to include UTI, alcohol abuse (possible seizure); no evidence of stroke or MI -Echocardiogram; see results below -Troponin 3 mildly positive -Brain MRI/MRA negative for acute infarct or masses. -TSH within normal limit -EEG; normal  Alcohol abuse -Alcohol level negative - perhaps patient's presentation was due to acute alcohol withdrawal -urine drug screen  negative  -Discontinued CIWA protocol, and all narcotics/sedating medication -Passed swallow study.   Acute Encephalopathy/AMS questionable etiology. Patient with confusion with some hallucinations. May be secondary to a worsening dementia. Patient with history of significant alcohol abuse. -Discontinued all benzodiazepine and narcotics - Patient with bouts of confusion and hallucination. Patient may have a component of dementia. CT head Negative for any acute abnormalities with moderate cortical volume loss and scattered small vessel ischemic microangiopathy. MRI of the head with moderate atrophy negative for acute infarct or mass. Folate levels at 16.1. B-12 levels at 397. B-12 levels are borderline will give a trial of vitamin B-12 1000 mcg [place] weekly 3 weeks.  - Patient has been seen in consultation by psychiatry and patient has been started on low-dose Seroquel for hallucinations. Seroquel to 50 mg daily at bedtime yesterday. Per psychiatry patient with no capacity. Patient will likely need SNF with 24-hour care on discharge. Psychiatry following.  -Swallow study; passed; dysphagia 1 honey thick Patient has been refusing Seroquel. Discussed with son option for NG tube, peg. Patient would not want artificial nutrition.  Suspect patient has component of dementia.   Hypoglycemia Patient noted to have a hypoglycemic spell 7-19. Patient also received some sliding scale insulin this morning. Patient's hypoglycemia likely secondary to decreased oral intake as patient is refusing to eat and refusing medications. Place on D5 normal saline. Follow. Refusing to eat.  No artifical nutrition after discussion with son.   Urinary tract infection with hematuria -Completed 5 day course antibiotics  iron deficiency Anemia May be dilutional in nature. Patient with no overt bleeding. Anemia panel with iron level of 21. Patient has received IV iron. Patient also on oral iron supplementation.  Hemoglobin currently stable at 7.6. Follow H&H. Transfusion threshold hemoglobin less than 7.  Rib fracture -Remote right fifth and sixth rib fractures; monitor for now  Hypokalemia/Hyperkalemia -Potassium goal> 4 -Kayexalate 30 gm 1 for hyperkalemia.   Hypomagnesemia  -Replete. Keep magnesium goal > 2   Stage II sacral decubitus Continue frequent turns.  Prophylaxis Heparin for DVT prophylaxis.  Failure to thrive/protein calorie malnutrition Patient with failure to thrive patient refusing oral intake. Patient with confusion and hallucinations. Patient with CT head and MRI of the head negative for acute abnormalities have a does have moderate atrophy and patient likely does have underlying dementia. Patient refusing oral medications. Patient refusing to eat. Continue Marinol tablets to see if his diet improve. Palliative care has been consulted for goals of care and further evaluation and management.  Patient continue to refuse meals, medications. Long discussion with son regarding options for artificial nutrition , ; NG tube, Peg tube, or more comfort approach. He think his father would not want artifical nutrition. Will proceed with more comfort approach.  Palliative team will review most form with patient son 7-22   Procedures: 7/4 CT head without contrast;No acute intracranial pathology -Moderate cortical volume loss  and scattered small vessel ischemic microangiopathy. 7/5 MRI brain without contrast; -moderate atrophy. -Negative for acute infarct or mass.  7/6 echocardiogram;- LVEF= 55%- 60%. Hypokinesis of the mid-apicalanteroseptal myocardium. 7/8 EEG; normal  Consultations:  Psych  palliative  Culture 7/4 blood left arm/hand NGTD 7/4 urine negative 7/5 MRSA by PCR negative  Discharge Exam: Filed Vitals:   05/10/15 0418  BP: 151/79  Pulse: 92  Temp: 98.3 F (36.8 C)  Resp: 18    General: Alert in no distress.  Cardiovascular: S 1, S 2  RRR Respiratory: CTA  Discharge Instructions   Discharge Instructions    Diet - low sodium heart healthy    Complete by:  As directed      Increase activity slowly    Complete by:  As directed           Current Discharge Medication List    START taking these medications   Details  cyanocobalamin (,VITAMIN B-12,) 1000 MCG/ML injection Inject 1 mL (1,000 mcg total) into the muscle once a week. Qty: 1 mL, Refills: 0    dronabinol (MARINOL) 2.5 MG capsule Take 1 capsule (2.5 mg total) by mouth 2 (two) times daily before lunch and supper. Qty: 30 capsule, Refills: 0    feeding supplement, ENSURE ENLIVE, (ENSURE ENLIVE) LIQD Take 237 mLs by mouth 3 (three) times daily between meals. Qty: 237 mL, Refills: 12    iron polysaccharides (NIFEREX) 150 MG capsule Take 1 capsule (150 mg total) by mouth daily. Qty: 30 capsule, Refills: 0    QUEtiapine (SEROQUEL) 25 MG tablet Take 2 tablets (50 mg total) by mouth at bedtime. Qty: 30 tablet, Refills: 0      CONTINUE these medications which have NOT CHANGED   Details  latanoprost (XALATAN) 0.005 % ophthalmic solution Place 1 drop into both eyes at bedtime.       No Known Allergies    The results of significant diagnostics from this hospitalization (including imaging, microbiology, ancillary and laboratory) are listed below for reference.    Significant Diagnostic Studies: Ct Head Wo Contrast  04/23/2015   CLINICAL DATA:  Found unresponsive. Altered mental status. Initial encounter.  EXAM: CT HEAD WITHOUT CONTRAST  TECHNIQUE: Contiguous axial images were obtained from the base of the skull through the vertex without intravenous contrast.  COMPARISON:  CT of the head performed 11/01/2014  FINDINGS: There is no evidence of acute infarction, mass lesion, or intra- or extra-axial hemorrhage on CT.  Prominence of the ventricles and sulci reflects moderate cortical volume loss. Mild cerebellar atrophy is noted. Scattered periventricular and  subcortical white matter change likely reflects small vessel ischemic microangiopathy.  The brainstem and fourth ventricle are within normal limits. The basal ganglia are unremarkable in appearance. The cerebral hemispheres demonstrate grossly normal gray-white differentiation. No mass effect or midline shift is seen.  There is no evidence of fracture; visualized osseous structures are unremarkable in appearance. Postoperative change is noted at the optic globes bilaterally. The orbits are within normal limits. The paranasal sinuses and mastoid air cells are well-aerated. Mild soft tissue swelling is noted at the right anterior and left posterior vertex.  IMPRESSION: 1. No acute intracranial pathology seen on CT. 2. Mild soft tissue swelling at the right anterior and left posterior vertex. 3. Moderate cortical volume loss and scattered small vessel ischemic microangiopathy.   Electronically Signed   By: Roanna Raider M.D.   On: 04/23/2015 00:24   Mr Maxine Glenn Head Wo Contrast  04/23/2015   CLINICAL  DATA:  Found on floor.  EXAM: MRI HEAD WITHOUT CONTRAST  MRA HEAD WITHOUT CONTRAST  TECHNIQUE: Multiplanar, multiecho pulse sequences of the brain and surrounding structures were obtained without intravenous contrast. Angiographic images of the head were obtained using MRA technique without contrast.  COMPARISON:  CT head 04/22/2015  FINDINGS: MRI HEAD FINDINGS  Moderate atrophy. Negative for hydrocephalus. Partially empty sella.  Negative for acute infarct.  No significant chronic ischemic change.  Chronic micro hemorrhage right frontal white matter. Possible cavernoma or hypertensive hemorrhage. No other hemorrhage or fluid collection  Negative for mass or edema.  No shift of the midline structures  Paranasal sinuses clear.  MRA HEAD FINDINGS  Both vertebral arteries patent to the basilar. PICA patent bilaterally. Basilar widely patent. Superior cerebellar and posterior cerebral arteries patent bilaterally. Mild stenosis  mid distal left posterior cerebral artery. Posterior communicating arteries are patent bilaterally  Cavernous carotid widely patent bilaterally. Dominant left A1 segment. Smaller right A1 segment. Anterior and middle cerebral arteries patent bilaterally without stenosis.  Negative for cerebral aneurysm.  IMPRESSION: Moderate atrophy. Negative for acute infarct or mass. Chronic micro hemorrhage right frontal white matter, possible cavernoma  Mild stenosis mid to distal left posterior cerebral artery. No flow limiting intracranial stenosis.   Electronically Signed   By: Marlan Palau M.D.   On: 04/23/2015 21:29   Mr Brain Wo Contrast  04/23/2015   CLINICAL DATA:  Found on floor.  EXAM: MRI HEAD WITHOUT CONTRAST  MRA HEAD WITHOUT CONTRAST  TECHNIQUE: Multiplanar, multiecho pulse sequences of the brain and surrounding structures were obtained without intravenous contrast. Angiographic images of the head were obtained using MRA technique without contrast.  COMPARISON:  CT head 04/22/2015  FINDINGS: MRI HEAD FINDINGS  Moderate atrophy. Negative for hydrocephalus. Partially empty sella.  Negative for acute infarct.  No significant chronic ischemic change.  Chronic micro hemorrhage right frontal white matter. Possible cavernoma or hypertensive hemorrhage. No other hemorrhage or fluid collection  Negative for mass or edema.  No shift of the midline structures  Paranasal sinuses clear.  MRA HEAD FINDINGS  Both vertebral arteries patent to the basilar. PICA patent bilaterally. Basilar widely patent. Superior cerebellar and posterior cerebral arteries patent bilaterally. Mild stenosis mid distal left posterior cerebral artery. Posterior communicating arteries are patent bilaterally  Cavernous carotid widely patent bilaterally. Dominant left A1 segment. Smaller right A1 segment. Anterior and middle cerebral arteries patent bilaterally without stenosis.  Negative for cerebral aneurysm.  IMPRESSION: Moderate atrophy. Negative  for acute infarct or mass. Chronic micro hemorrhage right frontal white matter, possible cavernoma  Mild stenosis mid to distal left posterior cerebral artery. No flow limiting intracranial stenosis.   Electronically Signed   By: Marlan Palau M.D.   On: 04/23/2015 21:29   Dg Chest Portable 1 View  04/22/2015   CLINICAL DATA:  Altered mental status after being found down  EXAM: PORTABLE CHEST - 1 VIEW  COMPARISON:  11/13/2008  FINDINGS: Normal heart size and mediastinal contours.  No acute infiltrate or edema. No effusion or pneumothorax. Probable nipple shadow on the left.  Remote right fifth and sixth rib fractures. No acute osseous findings.  IMPRESSION: 1. No active disease. 2. Nodular density at the left base is likely a nipple shadow. Two-view chest with nipple markers could confirm if clinically appropriate.   Electronically Signed   By: Marnee Spring M.D.   On: 04/22/2015 21:36    Microbiology: Recent Results (from the past 240 hour(s))  Clostridium Difficile by  PCR (not at [facility])     Status: None   Collection Time: 05/01/15  5:10 PM  Result Value Ref Range Status   C difficile by pcr NEGATIVE NEGATIVE Final     Labs: Basic Metabolic Panel:  Recent Labs Lab 05/05/15 0421 05/06/15 0536 05/07/15 0750 05/08/15 0417 05/09/15 0510  NA 140 142 143 140 141  K 3.6 3.6 3.0* 3.1* 3.4*  CL 111 113* 112* 110 111  CO2 23 22 25 24 22   GLUCOSE 75 90 38* 143* 92  BUN 5* <5* <5* <5* <5*  CREATININE 1.14 1.03 1.12 1.02 0.96  CALCIUM 7.7* 7.5* 8.1* 7.6* 7.8*  MG 1.7  --   --  1.6*  --    Liver Function Tests:  Recent Labs Lab 05/08/15 0417  AST 27  ALT 20  ALKPHOS 67  BILITOT 1.4*  PROT 5.1*  ALBUMIN 1.8*   No results for input(s): LIPASE, AMYLASE in the last 168 hours. No results for input(s): AMMONIA in the last 168 hours. CBC:  Recent Labs Lab 05/05/15 0421 05/06/15 0536 05/07/15 0750 05/08/15 0417 05/09/15 0510  WBC 7.6 7.9 8.8 5.9 5.4  HGB 7.6* 7.6* 8.0* 7.4* 7.9*   HCT 22.9* 22.2* 24.0* 21.9* 23.9*  MCV 95.4 93.7 94.9 94.8 94.8  PLT 329 298 337 315 292   Cardiac Enzymes: No results for input(s): CKTOTAL, CKMB, CKMBINDEX, TROPONINI in the last 168 hours. BNP: BNP (last 3 results) No results for input(s): BNP in the last 8760 hours.  ProBNP (last 3 results) No results for input(s): PROBNP in the last 8760 hours.  CBG:  Recent Labs Lab 05/09/15 2032 05/10/15 0001 05/10/15 0052 05/10/15 0408 05/10/15 0838  GLUCAP 82 63* 137* 112* 90       Signed:  Kaige Whistler A  [facility] 05/10/2015, 9:29 AM

## 2015-06-20 DEATH — deceased

## 2016-06-30 IMAGING — MR MR HEAD W/O CM
1 series · 20 of 24 positions shown · non-contrast
Comparison: Head CT without contrast 9633 hr the same day.

CLINICAL DATA: 89-year-old male with ataxia, possible stroke.
Initial encounter.

EXAM:
LIMITED MRI HEAD WITHOUT CONTRAST
TECHNIQUE: Multiplanar, multiecho pulse sequences of the brain and surrounding
structures were obtained without intravenous contrast.

[Series 3: T1 · sagittal · 5.0mm · 0.47mm/px · 20 of 24 slices shown]
[im 1/24]
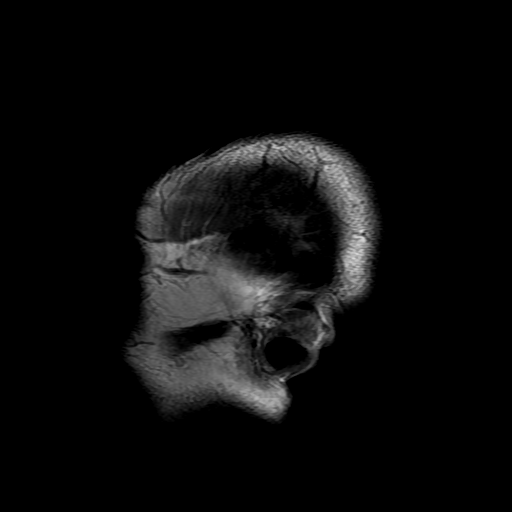
[im 2/24]
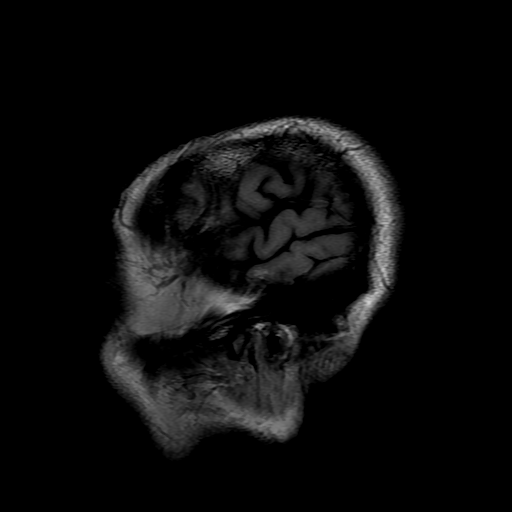
[im 3/24]
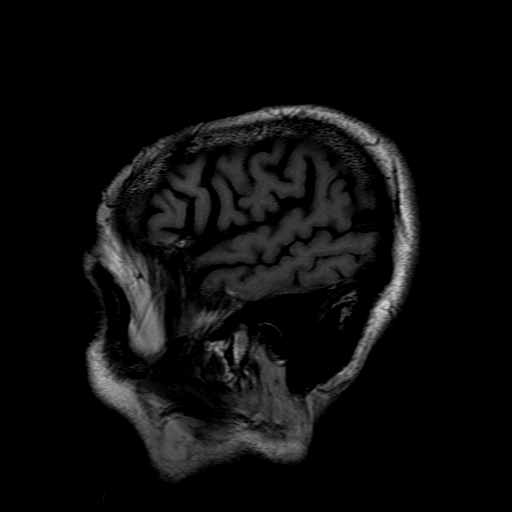
[im 4/24]
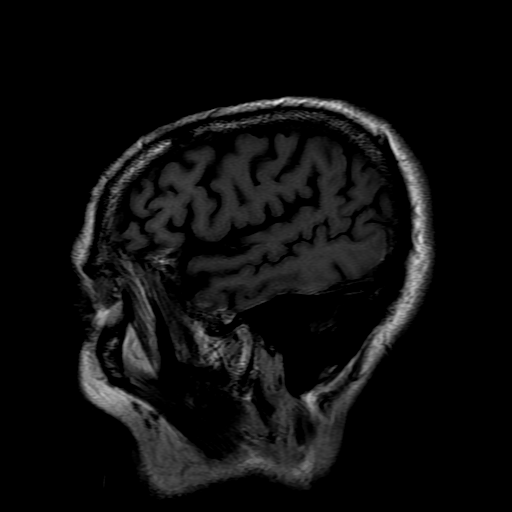
[im 5/24]
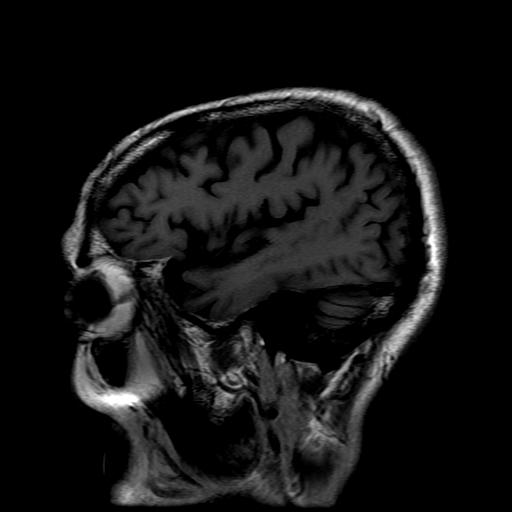
[im 6/24]
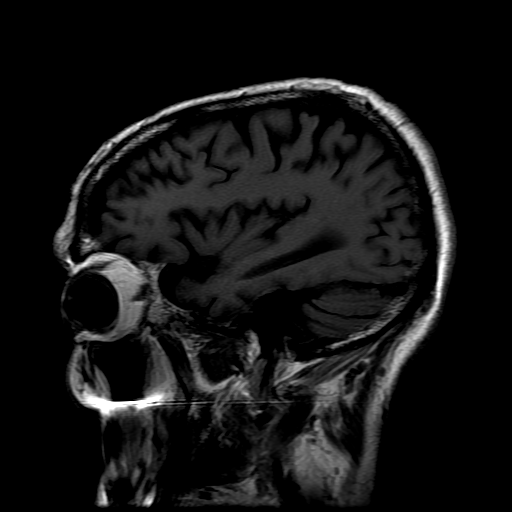
[im 7/24]
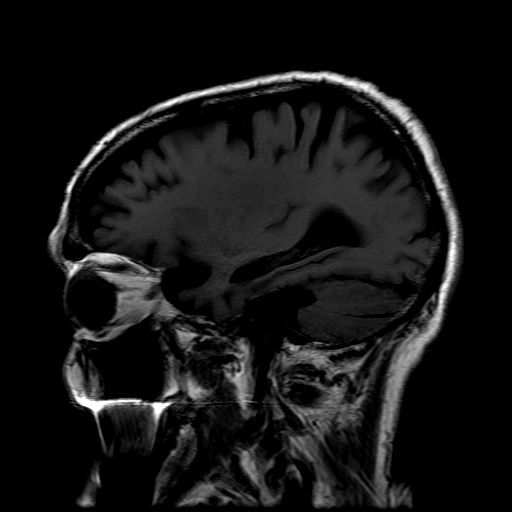
[im 8/24]
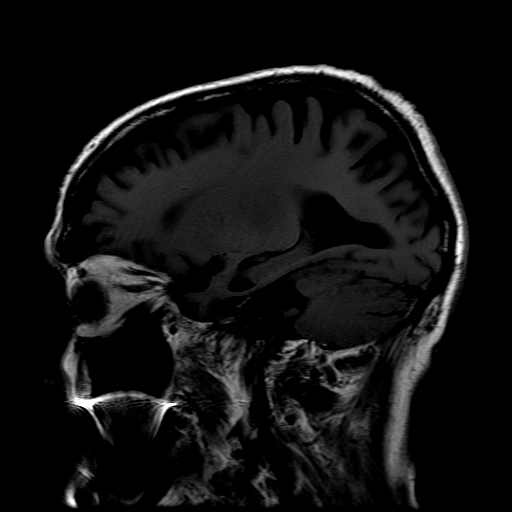
[im 9/24]
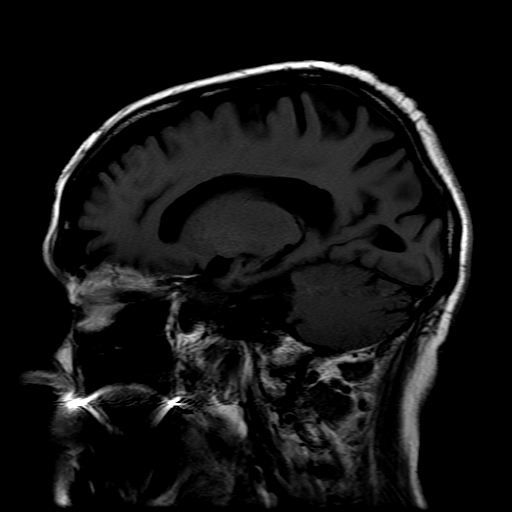
[im 10/24]
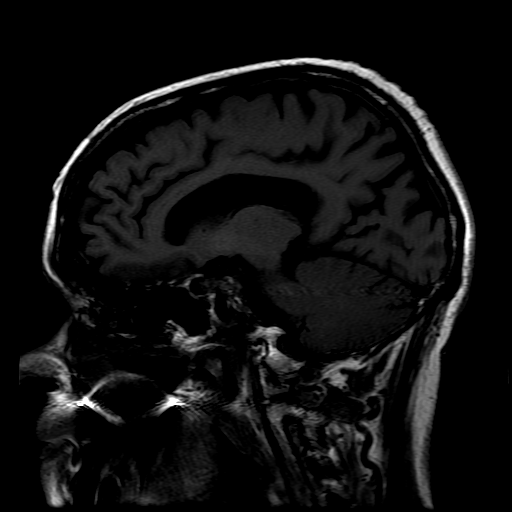
[im 11/24]
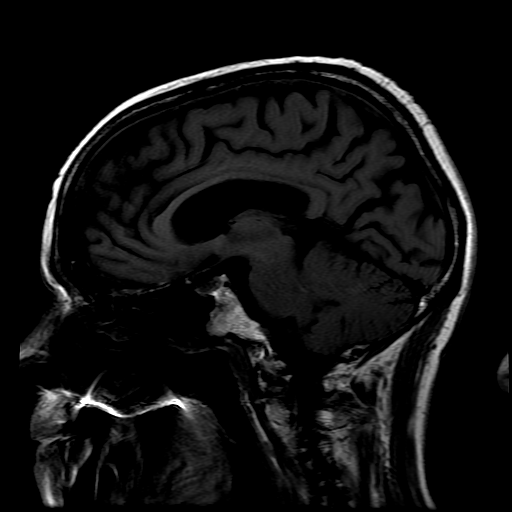
[im 12/24]
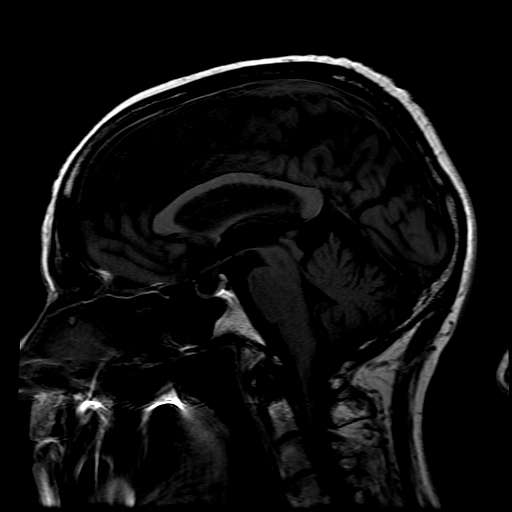
[im 13/24]
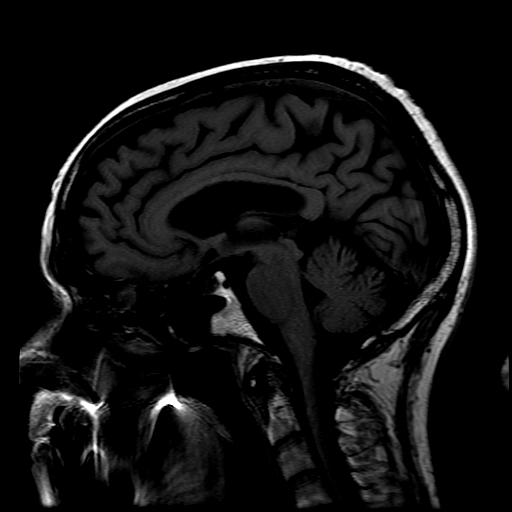
[im 14/24]
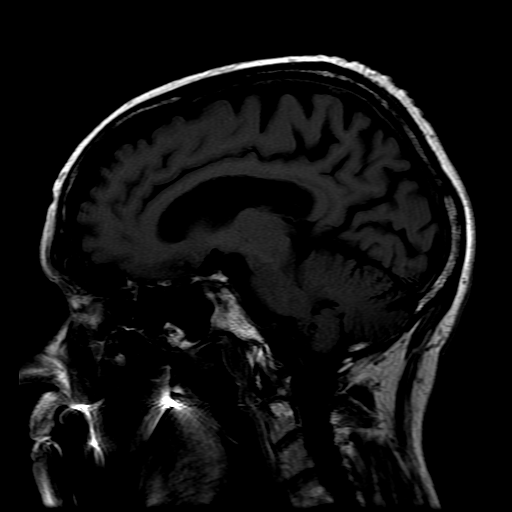
[im 15/24]
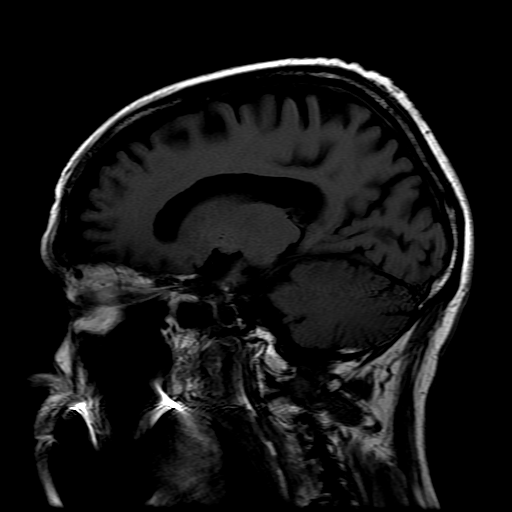
[im 16/24]
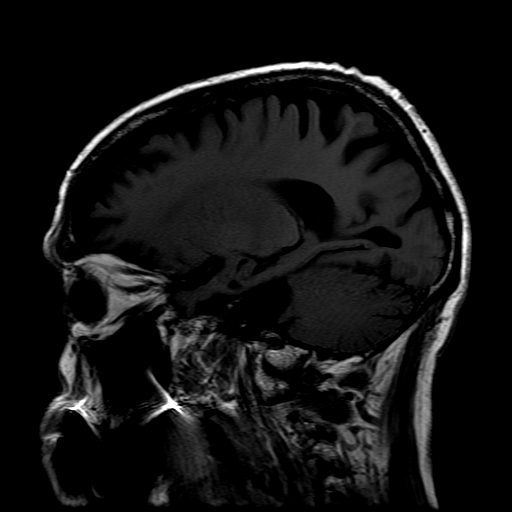
[im 17/24]
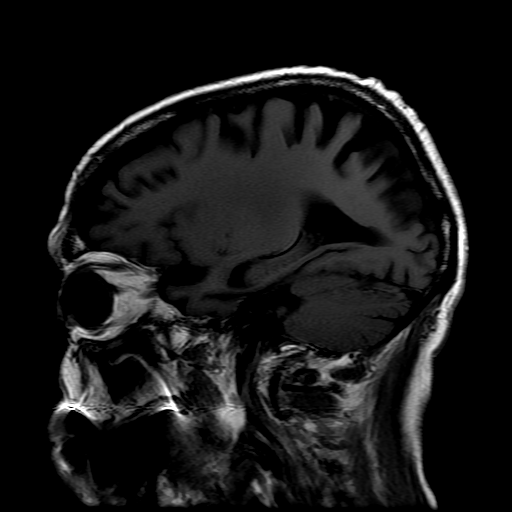
[im 20/24]
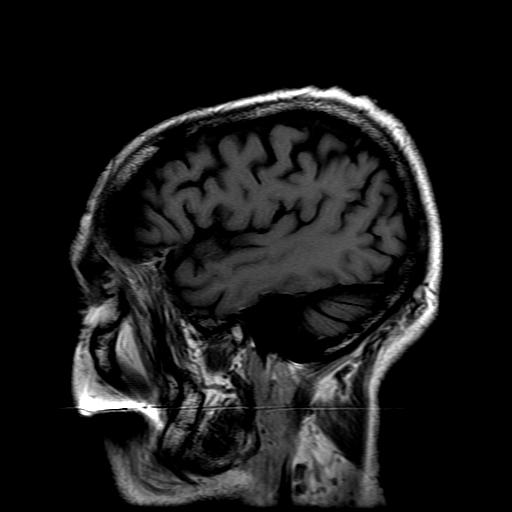
[im 21/24]
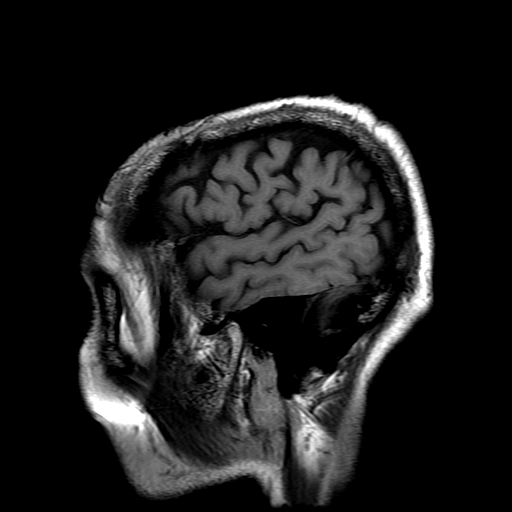
[im 23/24]
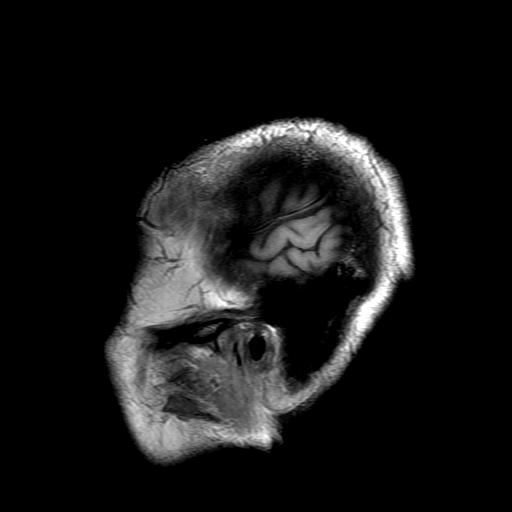

[20 of 24 positions shown; findings below may reference images not displayed]

FINDINGS: Despite discussion with the patient by the MRI technologist and the
ordering provider, he refused any MRI imaging beyond the sagittal T1
weighted series of the brain.

Those images reveal stable cerebral size and configuration with no
evidence of intracranial mass effect, ventriculomegaly, or definite
intracranial hemorrhage. Normal bone marrow signal. Grossly negative
visualized cervical spine and scalp soft tissues. Dental hardware
artifact.
IMPRESSION: The examination had to be discontinued prior to completion due to
patient refusal to continue.

## 2016-12-19 IMAGING — CT CT HEAD W/O CM
1 of 2 series · 13 of 30 positions shown, 17 images · non-contrast
Comparison: CT of the head performed 11/01/2014

CLINICAL DATA: Found unresponsive. Altered mental status. Initial
encounter.

EXAM:
CT HEAD WITHOUT CONTRAST
TECHNIQUE: Contiguous axial images were obtained from the base of the skull
through the vertex without intravenous contrast.

[Series 2: head 5.0 h30s · axial · 0.44mm/px · z∈[-118,+12]mm · 13 of 32 slices shown, 17 images]
[im 3/32  brain]
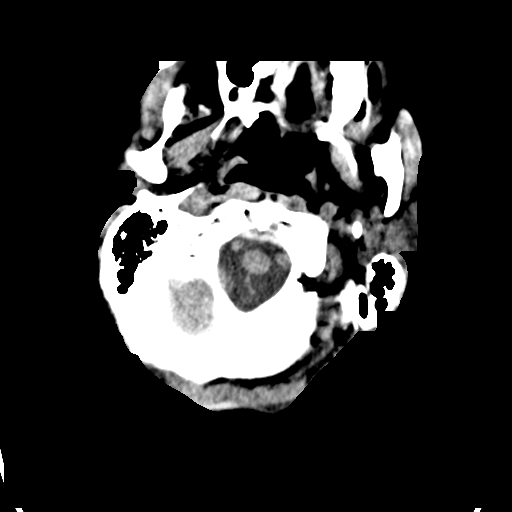
[im 3/32  bone]
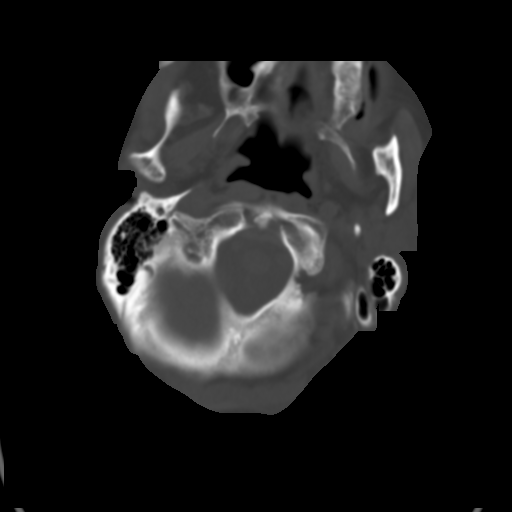
[im 5/32  brain]
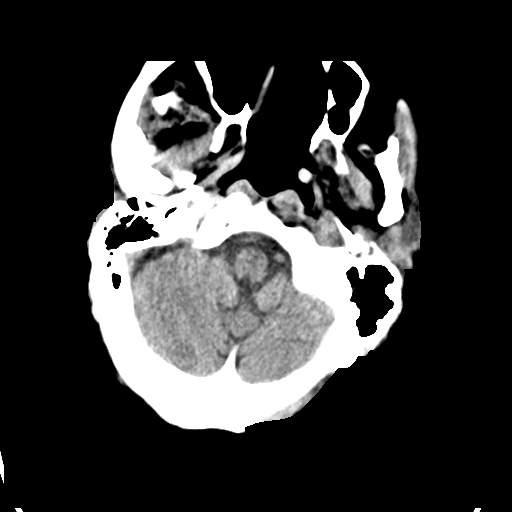
[im 7/32  brain]
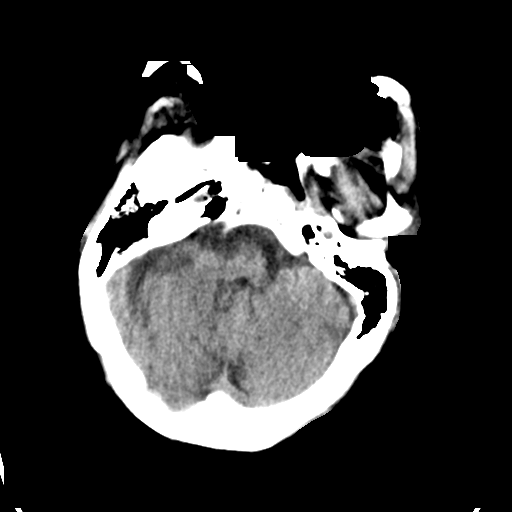
[im 9/32  brain]
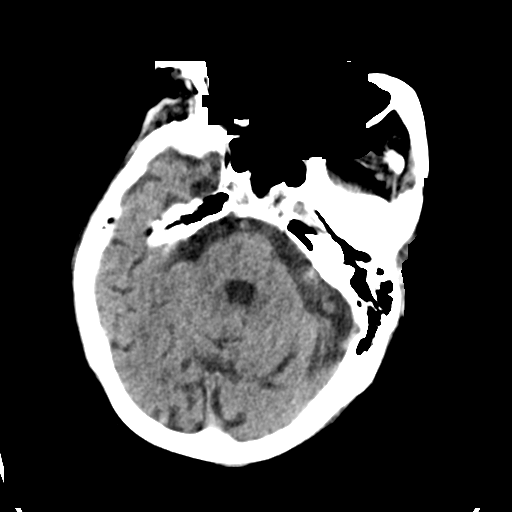
[im 12/32  brain]
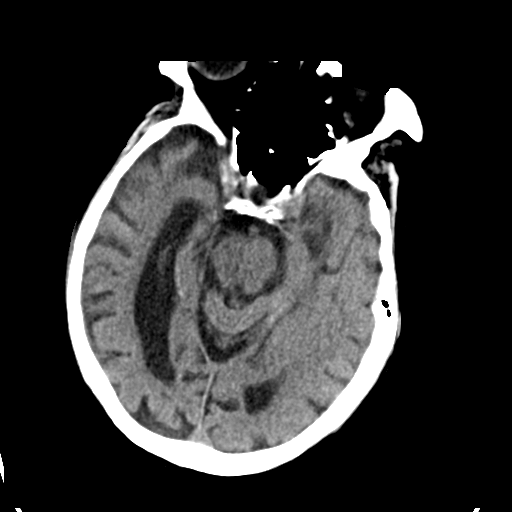
[im 12/32  bone]
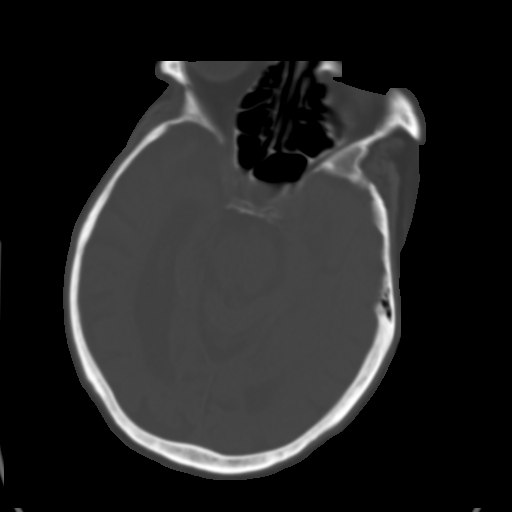
[im 14/32  brain]
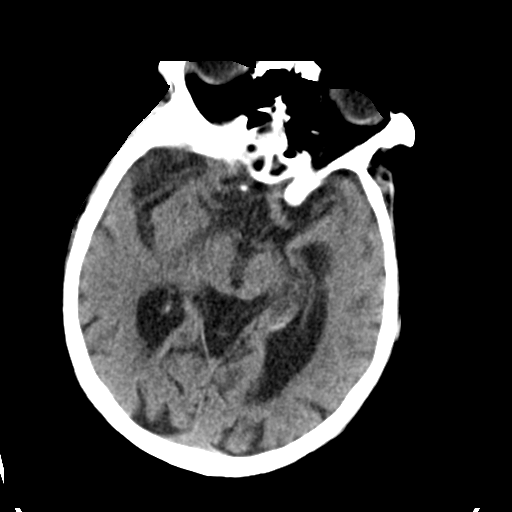
[im 16/32  brain]
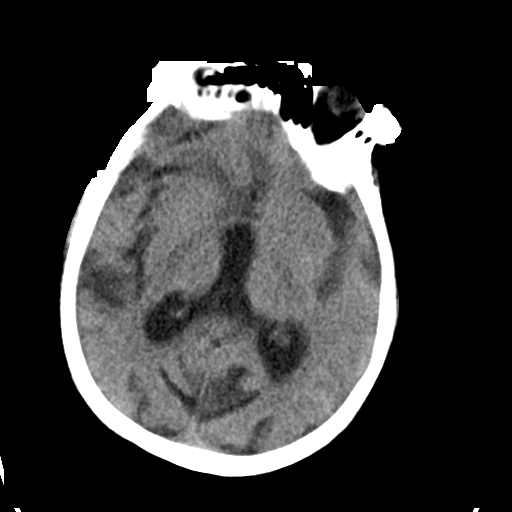
[im 18/32  brain]
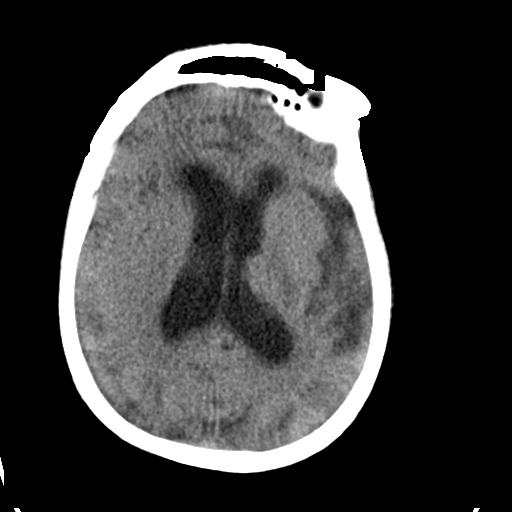
[im 20/32  brain]
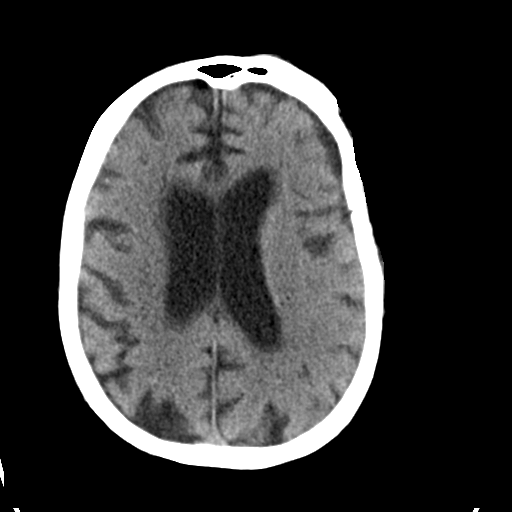
[im 20/32  bone]
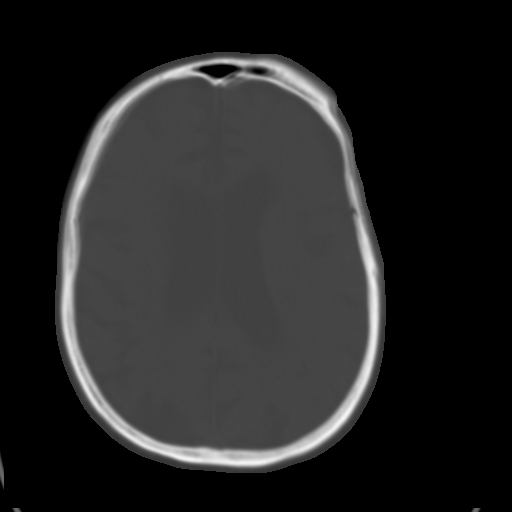
[im 23/32  brain]
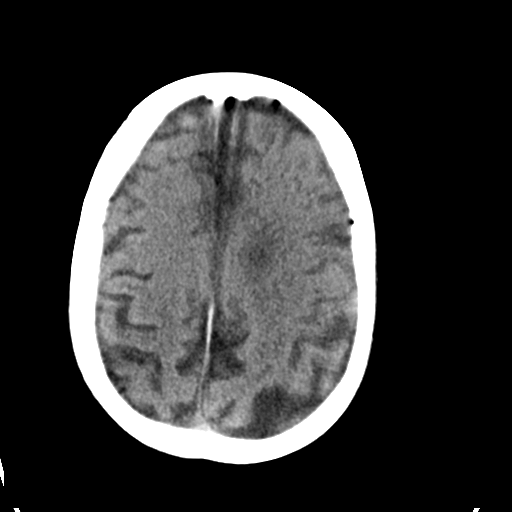
[im 25/32  brain]
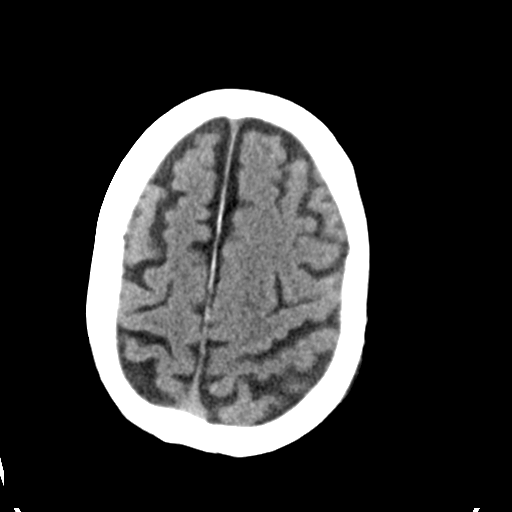
[im 27/32  brain]
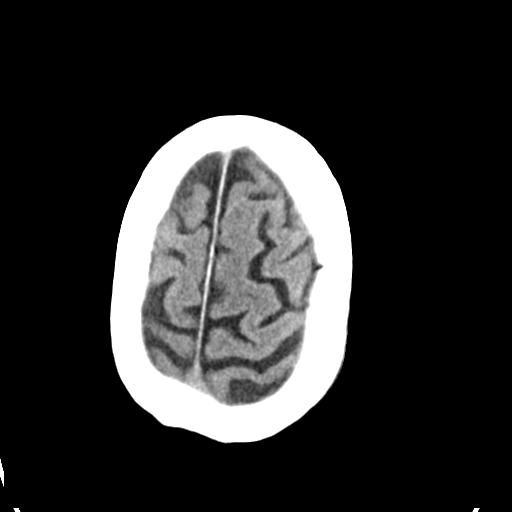
[im 29/32  brain]
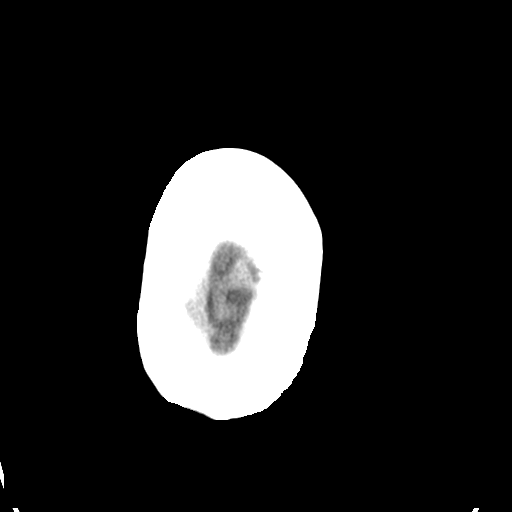
[im 29/32  bone]
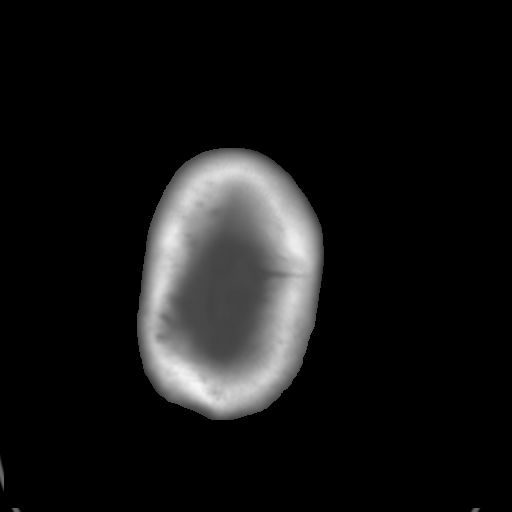

[13 of 30 positions shown; findings below may reference images not displayed]

FINDINGS: There is no evidence of acute infarction, mass lesion, or intra- or
extra-axial hemorrhage on CT.

Prominence of the ventricles and sulci reflects moderate cortical
volume loss. Mild cerebellar atrophy is noted. Scattered
periventricular and subcortical white matter change likely reflects
small vessel ischemic microangiopathy.

The brainstem and fourth ventricle are within normal limits. The
basal ganglia are unremarkable in appearance. The cerebral
hemispheres demonstrate grossly normal gray-white differentiation.
No mass effect or midline shift is seen.

There is no evidence of fracture; visualized osseous structures are
unremarkable in appearance. Postoperative change is noted at the
optic globes bilaterally. The orbits are within normal limits. The
paranasal sinuses and mastoid air cells are well-aerated. Mild soft
tissue swelling is noted at the right anterior and left posterior
vertex.
IMPRESSION: 1. No acute intracranial pathology seen on CT.
2. Mild soft tissue swelling at the right anterior and left
posterior vertex.
3. Moderate cortical volume loss and scattered small vessel ischemic
microangiopathy.
